# Patient Record
Sex: Female | Born: 1945 | Race: Black or African American | Hispanic: No | Smoking: Never smoker
Health system: Southern US, Community
[De-identification: ages and names within clinical notes are randomized; demographics above are authoritative.]

## PROBLEM LIST (undated history)

## (undated) DIAGNOSIS — I10 Essential (primary) hypertension: Secondary | ICD-10-CM

## (undated) DIAGNOSIS — Z8719 Personal history of other diseases of the digestive system: Secondary | ICD-10-CM

## (undated) DIAGNOSIS — H269 Unspecified cataract: Secondary | ICD-10-CM

## (undated) DIAGNOSIS — K219 Gastro-esophageal reflux disease without esophagitis: Secondary | ICD-10-CM

## (undated) DIAGNOSIS — M1990 Unspecified osteoarthritis, unspecified site: Secondary | ICD-10-CM

## (undated) DIAGNOSIS — F419 Anxiety disorder, unspecified: Secondary | ICD-10-CM

## (undated) DIAGNOSIS — E785 Hyperlipidemia, unspecified: Secondary | ICD-10-CM

## (undated) DIAGNOSIS — E119 Type 2 diabetes mellitus without complications: Secondary | ICD-10-CM

## (undated) DIAGNOSIS — K635 Polyp of colon: Secondary | ICD-10-CM

## (undated) DIAGNOSIS — J40 Bronchitis, not specified as acute or chronic: Secondary | ICD-10-CM

## (undated) DIAGNOSIS — J189 Pneumonia, unspecified organism: Secondary | ICD-10-CM

## (undated) HISTORY — PX: ESOPHAGOGASTRODUODENOSCOPY ENDOSCOPY: SHX5814

## (undated) HISTORY — PX: TUBAL LIGATION: SHX77

## (undated) HISTORY — PX: BREAST BIOPSY: SHX20

## (undated) HISTORY — PX: BREAST SURGERY: SHX581

## (undated) HISTORY — PX: ABDOMINAL HYSTERECTOMY: SHX81

## (undated) HISTORY — PX: BREAST LUMPECTOMY: SHX2

## (undated) HISTORY — PX: CARDIOVASCULAR STRESS TEST: SHX262

---

## 2011-08-12 ENCOUNTER — Other Ambulatory Visit (HOSPITAL_COMMUNITY)
Admission: RE | Admit: 2011-08-12 | Discharge: 2011-08-12 | Disposition: A | Discharge status: Home / Self Care | Payer: Medicare Other | Source: Ambulatory Visit | Attending: Family Medicine | Admitting: Family Medicine

## 2011-08-12 DIAGNOSIS — Z124 Encounter for screening for malignant neoplasm of cervix: Secondary | ICD-10-CM | POA: Insufficient documentation

## 2011-09-04 ENCOUNTER — Other Ambulatory Visit: Payer: Self-pay | Admitting: Family Medicine

## 2011-09-04 DIAGNOSIS — Z1231 Encounter for screening mammogram for malignant neoplasm of breast: Secondary | ICD-10-CM

## 2011-10-04 ENCOUNTER — Ambulatory Visit: Payer: Self-pay

## 2011-10-15 ENCOUNTER — Ambulatory Visit
Admission: RE | Admit: 2011-10-15 | Discharge: 2011-10-15 | Disposition: A | Discharge status: Home / Self Care | Payer: Medicare Other | Source: Ambulatory Visit | Attending: Family Medicine | Admitting: Family Medicine

## 2011-10-15 DIAGNOSIS — Z1231 Encounter for screening mammogram for malignant neoplasm of breast: Secondary | ICD-10-CM

## 2012-03-02 ENCOUNTER — Other Ambulatory Visit (HOSPITAL_COMMUNITY): Payer: Self-pay | Admitting: Orthopedic Surgery

## 2012-03-17 ENCOUNTER — Encounter (HOSPITAL_COMMUNITY): Payer: Self-pay | Admitting: Pharmacy Technician

## 2012-03-24 ENCOUNTER — Ambulatory Visit (HOSPITAL_COMMUNITY)
Admission: RE | Admit: 2012-03-24 | Discharge: 2012-03-24 | Disposition: A | Discharge status: Home / Self Care | Payer: Medicare Other | Source: Ambulatory Visit | Attending: Orthopedic Surgery | Admitting: Orthopedic Surgery

## 2012-03-24 ENCOUNTER — Encounter (HOSPITAL_COMMUNITY): Payer: Self-pay

## 2012-03-24 ENCOUNTER — Encounter (HOSPITAL_COMMUNITY)
Admission: RE | Admit: 2012-03-24 | Discharge: 2012-03-24 | Disposition: A | Discharge status: Home / Self Care | Payer: Medicare Other | Source: Ambulatory Visit | Attending: Orthopedic Surgery | Admitting: Orthopedic Surgery

## 2012-03-24 DIAGNOSIS — I10 Essential (primary) hypertension: Secondary | ICD-10-CM | POA: Insufficient documentation

## 2012-03-24 DIAGNOSIS — Z01812 Encounter for preprocedural laboratory examination: Secondary | ICD-10-CM | POA: Insufficient documentation

## 2012-03-24 DIAGNOSIS — Z01818 Encounter for other preprocedural examination: Secondary | ICD-10-CM | POA: Insufficient documentation

## 2012-03-24 HISTORY — DX: Unspecified osteoarthritis, unspecified site: M19.90

## 2012-03-24 HISTORY — DX: Hyperlipidemia, unspecified: E78.5

## 2012-03-24 HISTORY — DX: Anxiety disorder, unspecified: F41.9

## 2012-03-24 HISTORY — DX: Unspecified cataract: H26.9

## 2012-03-24 HISTORY — DX: Gastro-esophageal reflux disease without esophagitis: K21.9

## 2012-03-24 HISTORY — DX: Personal history of other diseases of the digestive system: Z87.19

## 2012-03-24 HISTORY — DX: Bronchitis, not specified as acute or chronic: J40

## 2012-03-24 HISTORY — DX: Type 2 diabetes mellitus without complications: E11.9

## 2012-03-24 HISTORY — DX: Pneumonia, unspecified organism: J18.9

## 2012-03-24 HISTORY — DX: Essential (primary) hypertension: I10

## 2012-03-24 LAB — BASIC METABOLIC PANEL
BUN: 20 mg/dL (ref 6–23)
Calcium: 10 mg/dL (ref 8.4–10.5)
Creatinine, Ser: 0.85 mg/dL (ref 0.50–1.10)
GFR calc Af Amer: 82 mL/min — ABNORMAL LOW (ref >90)
GFR calc non Af Amer: 70 mL/min — ABNORMAL LOW (ref >90)

## 2012-03-24 LAB — URINALYSIS, ROUTINE W REFLEX MICROSCOPIC
Glucose, UA: NEGATIVE mg/dL
Ketones, ur: NEGATIVE mg/dL
Leukocytes, UA: NEGATIVE
Nitrite: NEGATIVE
Protein, ur: NEGATIVE mg/dL
pH: 6.5 (ref 5.0–8.0)

## 2012-03-24 LAB — SURGICAL PCR SCREEN
MRSA, PCR: NEGATIVE
Staphylococcus aureus: NEGATIVE

## 2012-03-24 LAB — CBC
MCHC: 32.8 g/dL (ref 30.0–36.0)
Platelets: 273 10*3/uL (ref 150–400)
RDW: 14.4 % (ref 11.5–15.5)
WBC: 7.9 10*3/uL (ref 4.0–10.5)

## 2012-03-24 MED ORDER — CHLORHEXIDINE GLUCONATE 4 % EX LIQD: 60.0000 mL | Freq: Once | CUTANEOUS | Status: DC

## 2012-03-24 NOTE — Progress Notes (Signed)
Contacted Dr. Mila Palmer office, spoke with Judeth Cornfield. Requested last office visit notes and EKG.

## 2012-03-24 NOTE — Pre-Procedure Instructions (Signed)
20 Mercie Sailors  03/24/2012   Your procedure is scheduled on:  Tuesday Novmeber 12, 2013  Report to [facility] at 9:25 AM.  Call this number if you have problems the morning of surgery: [phone]   Remember:   Do not eat food or drink :After Midnight.      Take these medicines the morning of surgery with A SIP OF WATER: amlodipine, nexium, ativan,    Do not wear jewelry, make-up or nail polish.  Do not wear lotions, powders, or perfumes.   Do not shave 48 hours prior to surgery. Men may shave face and neck.  Do not bring valuables to the hospital.  Contacts, dentures or bridgework may not be worn into surgery.  Leave suitcase in the car. After surgery it may be brought to your room.  For patients admitted to the hospital, checkout time is 11:00 AM the day of discharge.   Patients discharged the day of surgery will not be allowed to drive home.  Name and phone number of your driver: Donna Wu  Special Instructions: Shower using CHG 2 nights before surgery and the night before surgery.  If you shower the day of surgery use CHG.  Use special wash - you have one bottle of CHG for all showers.  You should use approximately 1/3 of the bottle for each shower.   Please read over the following fact sheets that you were given: Pain Booklet, Coughing and Deep Breathing, Blood Transfusion Information, Total Joint Packet, MRSA Information and Surgical Site Infection Prevention

## 2012-03-25 LAB — URINE CULTURE: Colony Count: NO GROWTH

## 2012-03-25 NOTE — Consult Note (Signed)
Anesthesia chart review: Patient is a 66 year old female scheduled for left total knee arthroplasty by Dr. August Saucer on 03/31/2012. History includes nonsmoker, hyperlipidemia, anxiety, GERD, diabetes mellitus type 2, hypertension, cataracts, arthritis, history of bronchitis and pneumonia.  Labs noted.  Chest x-ray on 03/24/2012 showed no evidence of acute cardiopulmonary abnormality.  EKG on 03/24/2012 showed sinus tachycardia at 111 beats per minute, occasional PVC, cannot rule out anterior infarct, age undetermined.  Her HR was 106 at the time of vitals on 03/24/12.  Her heart rate has increased and she now has a single PVC on EKG, otherwise I do not think that there is a significant change from her EKG from 08/12/11 from her PCP Dr. Hervey Ard.  Her last stress test was approximately 7 years ago.  No CV symptoms documented at her PAT visit.  She will be evaluated by her anesthesiologist on the day of surgery.  If she remains asymptomatic from a CV standpoint then anticipate she can proceed as planned.  Shonna Chock, PA-C

## 2012-03-30 MED ORDER — CEFAZOLIN SODIUM-DEXTROSE 2-3 GM-% IV SOLR
2.0000 g | INTRAVENOUS | Status: AC
  Administered 2012-03-31: 2 g via INTRAVENOUS
  Filled 2012-03-30: qty 50

## 2012-03-31 ENCOUNTER — Inpatient Hospital Stay (HOSPITAL_COMMUNITY)
Admission: RE | Admit: 2012-03-31 | Discharge: 2012-04-03 | DRG: 470 | Disposition: A | Discharge status: Skilled Nursing Facility | Payer: Medicare Other | Source: Ambulatory Visit | Attending: Orthopedic Surgery | Admitting: Orthopedic Surgery

## 2012-03-31 ENCOUNTER — Encounter (HOSPITAL_COMMUNITY): Payer: Self-pay | Admitting: Vascular Surgery

## 2012-03-31 ENCOUNTER — Encounter (HOSPITAL_COMMUNITY): Payer: Self-pay | Admitting: *Deleted

## 2012-03-31 ENCOUNTER — Encounter (HOSPITAL_COMMUNITY)
Admission: RE | Disposition: A | Discharge status: Skilled Nursing Facility | Payer: Self-pay | Source: Ambulatory Visit | Attending: Orthopedic Surgery

## 2012-03-31 ENCOUNTER — Inpatient Hospital Stay (HOSPITAL_COMMUNITY): Payer: Medicare Other | Admitting: Vascular Surgery

## 2012-03-31 DIAGNOSIS — K449 Diaphragmatic hernia without obstruction or gangrene: Secondary | ICD-10-CM | POA: Diagnosis present

## 2012-03-31 DIAGNOSIS — E785 Hyperlipidemia, unspecified: Secondary | ICD-10-CM | POA: Diagnosis present

## 2012-03-31 DIAGNOSIS — M1710 Unilateral primary osteoarthritis, unspecified knee: Principal | ICD-10-CM | POA: Diagnosis present

## 2012-03-31 DIAGNOSIS — Z7982 Long term (current) use of aspirin: Secondary | ICD-10-CM

## 2012-03-31 DIAGNOSIS — Z79899 Other long term (current) drug therapy: Secondary | ICD-10-CM

## 2012-03-31 DIAGNOSIS — F411 Generalized anxiety disorder: Secondary | ICD-10-CM | POA: Diagnosis present

## 2012-03-31 DIAGNOSIS — K219 Gastro-esophageal reflux disease without esophagitis: Secondary | ICD-10-CM | POA: Diagnosis present

## 2012-03-31 DIAGNOSIS — H269 Unspecified cataract: Secondary | ICD-10-CM | POA: Diagnosis present

## 2012-03-31 DIAGNOSIS — D62 Acute posthemorrhagic anemia: Secondary | ICD-10-CM | POA: Diagnosis not present

## 2012-03-31 DIAGNOSIS — I10 Essential (primary) hypertension: Secondary | ICD-10-CM | POA: Diagnosis present

## 2012-03-31 DIAGNOSIS — E119 Type 2 diabetes mellitus without complications: Secondary | ICD-10-CM | POA: Diagnosis present

## 2012-03-31 DIAGNOSIS — E669 Obesity, unspecified: Secondary | ICD-10-CM | POA: Diagnosis present

## 2012-03-31 DIAGNOSIS — Z23 Encounter for immunization: Secondary | ICD-10-CM

## 2012-03-31 HISTORY — PX: TOTAL KNEE ARTHROPLASTY: SHX125

## 2012-03-31 LAB — PROTIME-INR
INR: 1.06 (ref 0.00–1.49)
Prothrombin Time: 13.7 seconds (ref 11.6–15.2)

## 2012-03-31 LAB — GLUCOSE, CAPILLARY
Glucose-Capillary: 117 mg/dL — ABNORMAL HIGH (ref 70–99)
Glucose-Capillary: 127 mg/dL — ABNORMAL HIGH (ref 70–99)
Glucose-Capillary: 167 mg/dL — ABNORMAL HIGH (ref 70–99)

## 2012-03-31 SURGERY — ARTHROPLASTY, KNEE, TOTAL
Anesthesia: General | Site: Knee | Laterality: Left | Wound class: Clean

## 2012-03-31 MED ORDER — MORPHINE SULFATE (PF) 1 MG/ML IV SOLN
INTRAVENOUS | Status: AC
  Filled 2012-03-31: qty 25

## 2012-03-31 MED ORDER — LACTATED RINGERS IV SOLN
INTRAVENOUS | Status: DC
  Administered 2012-03-31: 11:00:00 via INTRAVENOUS

## 2012-03-31 MED ORDER — BENAZEPRIL HCL 40 MG PO TABS
80.0000 mg | ORAL_TABLET | Freq: Every day | ORAL | Status: DC
  Administered 2012-04-01 – 2012-04-03 (×3): 80 mg via ORAL
  Filled 2012-03-31 (×3): qty 2

## 2012-03-31 MED ORDER — ACETAMINOPHEN 10 MG/ML IV SOLN
INTRAVENOUS | Status: DC | PRN
  Administered 2012-03-31: 1000 mg via INTRAVENOUS

## 2012-03-31 MED ORDER — MIDAZOLAM HCL 5 MG/5ML IJ SOLN
INTRAMUSCULAR | Status: DC | PRN
  Administered 2012-03-31: 2 mg via INTRAVENOUS

## 2012-03-31 MED ORDER — BUPIVACAINE-EPINEPHRINE 0.25% -1:200000 IJ SOLN
INTRAMUSCULAR | Status: DC | PRN
  Administered 2012-03-31: 20 mL

## 2012-03-31 MED ORDER — VITAMIN D3 25 MCG (1000 UNIT) PO TABS
2000.0000 [IU] | ORAL_TABLET | Freq: Every day | ORAL | Status: DC
  Administered 2012-04-01 – 2012-04-03 (×3): 2000 [IU] via ORAL
  Filled 2012-03-31 (×3): qty 2

## 2012-03-31 MED ORDER — FENTANYL CITRATE 0.05 MG/ML IJ SOLN
INTRAMUSCULAR | Status: DC | PRN
  Administered 2012-03-31 (×6): 50 ug via INTRAVENOUS
  Administered 2012-03-31: 100 ug via INTRAVENOUS
  Administered 2012-03-31 (×2): 50 ug via INTRAVENOUS

## 2012-03-31 MED ORDER — METHOCARBAMOL 500 MG PO TABS
500.0000 mg | ORAL_TABLET | Freq: Four times a day (QID) | ORAL | Status: DC | PRN
  Administered 2012-04-01: 500 mg via ORAL
  Filled 2012-03-31 (×2): qty 1

## 2012-03-31 MED ORDER — ROCURONIUM BROMIDE 100 MG/10ML IV SOLN
INTRAVENOUS | Status: DC | PRN
  Administered 2012-03-31: 50 mg via INTRAVENOUS

## 2012-03-31 MED ORDER — ARTIFICIAL TEARS OP OINT
TOPICAL_OINTMENT | OPHTHALMIC | Status: DC | PRN
  Administered 2012-03-31: 1 via OPHTHALMIC

## 2012-03-31 MED ORDER — BUPIVACAINE HCL (PF) 0.25 % IJ SOLN
INTRAMUSCULAR | Status: AC
  Filled 2012-03-31: qty 30

## 2012-03-31 MED ORDER — MORPHINE SULFATE (PF) 1 MG/ML IV SOLN
INTRAVENOUS | Status: DC
Start: 2012-03-31 — End: 2012-04-02
  Administered 2012-03-31: 6 mg via INTRAVENOUS
  Administered 2012-03-31: 15:00:00 via INTRAVENOUS
  Administered 2012-04-01: 9.94 mg via INTRAVENOUS
  Administered 2012-04-01: 7.3 mg via INTRAVENOUS
  Administered 2012-04-01: 15:00:00 via INTRAVENOUS
  Administered 2012-04-01: 25 mg via INTRAVENOUS
  Administered 2012-04-01: 7.5 mg via INTRAVENOUS
  Administered 2012-04-01: 3 mg via INTRAVENOUS
  Administered 2012-04-01: 12 mg via INTRAVENOUS
  Administered 2012-04-01: 13.2 mg via INTRAVENOUS
  Administered 2012-04-02: 3 mg via INTRAVENOUS
  Filled 2012-03-31 (×3): qty 25

## 2012-03-31 MED ORDER — WARFARIN - PHARMACIST DOSING INPATIENT
Freq: Every day | Status: DC
Start: 2012-03-31 — End: 2012-04-03

## 2012-03-31 MED ORDER — MENTHOL 3 MG MT LOZG: 1.0000 | LOZENGE | OROMUCOSAL | Status: DC | PRN

## 2012-03-31 MED ORDER — PROMETHAZINE HCL 25 MG/ML IJ SOLN: 6.2500 mg | INTRAMUSCULAR | Status: DC | PRN

## 2012-03-31 MED ORDER — AMLODIPINE BESY-BENAZEPRIL HCL 5-40 MG PO CAPS: 2.0000 | ORAL_CAPSULE | Freq: Every day | ORAL | Status: DC

## 2012-03-31 MED ORDER — ASPIRIN EC 81 MG PO TBEC
81.0000 mg | DELAYED_RELEASE_TABLET | Freq: Every day | ORAL | Status: DC
  Administered 2012-03-31 – 2012-04-03 (×4): 81 mg via ORAL
  Filled 2012-03-31 (×5): qty 1

## 2012-03-31 MED ORDER — SODIUM CHLORIDE 0.9 % IJ SOLN: 9.0000 mL | INTRAMUSCULAR | Status: DC | PRN

## 2012-03-31 MED ORDER — CLONIDINE HCL (ANALGESIA) 100 MCG/ML EP SOLN
150.0000 ug | EPIDURAL | Status: AC
  Administered 2012-03-31: 1 mL via INTRA_ARTICULAR
  Filled 2012-03-31: qty 1.5

## 2012-03-31 MED ORDER — HYDROCODONE-ACETAMINOPHEN 7.5-325 MG PO TABS
1.0000 | ORAL_TABLET | Freq: Four times a day (QID) | ORAL | Status: DC
  Administered 2012-04-01 – 2012-04-03 (×8): 1 via ORAL
  Filled 2012-03-31 (×8): qty 1

## 2012-03-31 MED ORDER — POTASSIUM CHLORIDE IN NACL 20-0.45 MEQ/L-% IV SOLN
INTRAVENOUS | Status: DC
  Administered 2012-04-01: 1000 mL via INTRAVENOUS
  Filled 2012-03-31 (×3): qty 1000

## 2012-03-31 MED ORDER — HYDROMORPHONE HCL PF 1 MG/ML IJ SOLN
INTRAMUSCULAR | Status: AC
  Filled 2012-03-31: qty 1

## 2012-03-31 MED ORDER — LACTATED RINGERS IV SOLN
INTRAVENOUS | Status: DC | PRN
  Administered 2012-03-31 (×3): via INTRAVENOUS

## 2012-03-31 MED ORDER — AMLODIPINE BESYLATE 10 MG PO TABS
10.0000 mg | ORAL_TABLET | Freq: Every day | ORAL | Status: DC
  Administered 2012-04-01 – 2012-04-03 (×3): 10 mg via ORAL
  Filled 2012-03-31 (×3): qty 1

## 2012-03-31 MED ORDER — CEFAZOLIN SODIUM 1-5 GM-% IV SOLN
1.0000 g | Freq: Four times a day (QID) | INTRAVENOUS | Status: AC
  Administered 2012-03-31 (×2): 1 g via INTRAVENOUS
  Filled 2012-03-31 (×3): qty 50

## 2012-03-31 MED ORDER — PANTOPRAZOLE SODIUM 40 MG PO TBEC
80.0000 mg | DELAYED_RELEASE_TABLET | Freq: Every day | ORAL | Status: DC
  Administered 2012-04-01 – 2012-04-03 (×3): 80 mg via ORAL
  Filled 2012-03-31 (×3): qty 2

## 2012-03-31 MED ORDER — NEOSTIGMINE METHYLSULFATE 1 MG/ML IJ SOLN
INTRAMUSCULAR | Status: DC | PRN
  Administered 2012-03-31: 3 mg via INTRAVENOUS

## 2012-03-31 MED ORDER — LORAZEPAM 0.5 MG PO TABS: 0.5000 mg | ORAL_TABLET | Freq: Three times a day (TID) | ORAL | Status: DC | PRN

## 2012-03-31 MED ORDER — ACETAMINOPHEN 10 MG/ML IV SOLN
INTRAVENOUS | Status: AC
  Filled 2012-03-31: qty 100

## 2012-03-31 MED ORDER — LIDOCAINE HCL (CARDIAC) 20 MG/ML IV SOLN
INTRAVENOUS | Status: DC | PRN
  Administered 2012-03-31: 80 mg via INTRAVENOUS

## 2012-03-31 MED ORDER — FLEET ENEMA 7-19 GM/118ML RE ENEM: 1.0000 | ENEMA | Freq: Once | RECTAL | Status: AC | PRN

## 2012-03-31 MED ORDER — PHENYLEPHRINE HCL 10 MG/ML IJ SOLN
INTRAMUSCULAR | Status: DC | PRN
  Administered 2012-03-31 (×4): 40 ug via INTRAVENOUS
  Administered 2012-03-31: 80 ug via INTRAVENOUS
  Administered 2012-03-31: 40 ug via INTRAVENOUS

## 2012-03-31 MED ORDER — HYDROCHLOROTHIAZIDE 25 MG PO TABS
25.0000 mg | ORAL_TABLET | Freq: Every day | ORAL | Status: DC
  Administered 2012-04-01 – 2012-04-03 (×3): 25 mg via ORAL
  Filled 2012-03-31 (×3): qty 1

## 2012-03-31 MED ORDER — ONDANSETRON HCL 4 MG PO TABS: 4.0000 mg | ORAL_TABLET | Freq: Four times a day (QID) | ORAL | Status: DC | PRN

## 2012-03-31 MED ORDER — HYDROMORPHONE HCL PF 1 MG/ML IJ SOLN
0.2500 mg | INTRAMUSCULAR | Status: DC | PRN
  Administered 2012-03-31 (×2): 0.5 mg via INTRAVENOUS

## 2012-03-31 MED ORDER — SENNOSIDES-DOCUSATE SODIUM 8.6-50 MG PO TABS: 1.0000 | ORAL_TABLET | Freq: Every evening | ORAL | Status: DC | PRN

## 2012-03-31 MED ORDER — GLYCOPYRROLATE 0.2 MG/ML IJ SOLN
INTRAMUSCULAR | Status: DC | PRN
  Administered 2012-03-31: 0.4 mg via INTRAVENOUS

## 2012-03-31 MED ORDER — WARFARIN SODIUM 5 MG PO TABS
5.0000 mg | ORAL_TABLET | Freq: Once | ORAL | Status: AC
  Administered 2012-03-31: 5 mg via ORAL
  Filled 2012-03-31: qty 1

## 2012-03-31 MED ORDER — ONDANSETRON HCL 4 MG/2ML IJ SOLN: 4.0000 mg | Freq: Four times a day (QID) | INTRAMUSCULAR | Status: DC | PRN

## 2012-03-31 MED ORDER — MORPHINE SULFATE 4 MG/ML IJ SOLN
INTRAMUSCULAR | Status: DC | PRN
  Administered 2012-03-31: 8 mg

## 2012-03-31 MED ORDER — SIMVASTATIN 40 MG PO TABS
40.0000 mg | ORAL_TABLET | Freq: Every day | ORAL | Status: DC
  Administered 2012-03-31 – 2012-04-02 (×3): 40 mg via ORAL
  Filled 2012-03-31 (×4): qty 1

## 2012-03-31 MED ORDER — ACETAMINOPHEN 650 MG RE SUPP: 650.0000 mg | Freq: Four times a day (QID) | RECTAL | Status: DC | PRN

## 2012-03-31 MED ORDER — PNEUMOCOCCAL VAC POLYVALENT 25 MCG/0.5ML IJ INJ
0.5000 mL | INJECTION | INTRAMUSCULAR | Status: AC
  Administered 2012-04-01: 0.5 mL via INTRAMUSCULAR
  Filled 2012-03-31: qty 0.5

## 2012-03-31 MED ORDER — PROPOFOL 10 MG/ML IV BOLUS
INTRAVENOUS | Status: DC | PRN
  Administered 2012-03-31: 170 mg via INTRAVENOUS

## 2012-03-31 MED ORDER — PHENOL 1.4 % MT LIQD: 1.0000 | OROMUCOSAL | Status: DC | PRN

## 2012-03-31 MED ORDER — DEXTROSE 5 % IV SOLN
500.0000 mg | Freq: Four times a day (QID) | INTRAVENOUS | Status: DC | PRN
  Administered 2012-03-31: 500 mg via INTRAVENOUS
  Filled 2012-03-31: qty 5

## 2012-03-31 MED ORDER — DIPHENHYDRAMINE HCL 12.5 MG/5ML PO ELIX: 12.5000 mg | ORAL_SOLUTION | Freq: Four times a day (QID) | ORAL | Status: DC | PRN

## 2012-03-31 MED ORDER — OXYCODONE HCL 5 MG PO TABS: 5.0000 mg | ORAL_TABLET | Freq: Once | ORAL | Status: DC | PRN

## 2012-03-31 MED ORDER — DIPHENHYDRAMINE HCL 12.5 MG/5ML PO ELIX: 12.5000 mg | ORAL_SOLUTION | ORAL | Status: DC | PRN

## 2012-03-31 MED ORDER — OXYCODONE HCL 5 MG/5ML PO SOLN: 5.0000 mg | Freq: Once | ORAL | Status: DC | PRN

## 2012-03-31 MED ORDER — BUPIVACAINE-EPINEPHRINE PF 0.25-1:200000 % IJ SOLN
INTRAMUSCULAR | Status: AC
  Filled 2012-03-31: qty 30

## 2012-03-31 MED ORDER — ACETAMINOPHEN 325 MG PO TABS
650.0000 mg | ORAL_TABLET | Freq: Four times a day (QID) | ORAL | Status: DC | PRN
  Administered 2012-04-03: 650 mg via ORAL
  Filled 2012-03-31: qty 2

## 2012-03-31 MED ORDER — DIPHENHYDRAMINE HCL 50 MG/ML IJ SOLN: 12.5000 mg | Freq: Four times a day (QID) | INTRAMUSCULAR | Status: DC | PRN

## 2012-03-31 MED ORDER — OXYCODONE HCL 5 MG PO TABS
5.0000 mg | ORAL_TABLET | ORAL | Status: DC | PRN
Start: 2012-03-31 — End: 2012-04-03
  Administered 2012-04-01 (×2): 5 mg via ORAL
  Administered 2012-04-03: 10 mg via ORAL
  Filled 2012-03-31 (×2): qty 1
  Filled 2012-03-31: qty 2

## 2012-03-31 MED ORDER — DOCUSATE SODIUM 100 MG PO CAPS
100.0000 mg | ORAL_CAPSULE | Freq: Two times a day (BID) | ORAL | Status: DC
  Administered 2012-03-31 – 2012-04-03 (×6): 100 mg via ORAL
  Filled 2012-03-31 (×6): qty 1

## 2012-03-31 MED ORDER — METOCLOPRAMIDE HCL 5 MG/ML IJ SOLN: 5.0000 mg | Freq: Three times a day (TID) | INTRAMUSCULAR | Status: DC | PRN

## 2012-03-31 MED ORDER — MEPERIDINE HCL 25 MG/ML IJ SOLN: 6.2500 mg | INTRAMUSCULAR | Status: DC | PRN

## 2012-03-31 MED ORDER — ALUM & MAG HYDROXIDE-SIMETH 200-200-20 MG/5ML PO SUSP: 30.0000 mL | ORAL | Status: DC | PRN

## 2012-03-31 MED ORDER — MORPHINE SULFATE 4 MG/ML IJ SOLN
INTRAMUSCULAR | Status: AC
  Filled 2012-03-31: qty 2

## 2012-03-31 MED ORDER — INSULIN ASPART 100 UNIT/ML ~~LOC~~ SOLN
0.0000 [IU] | Freq: Three times a day (TID) | SUBCUTANEOUS | Status: DC
  Administered 2012-04-01: 5 [IU] via SUBCUTANEOUS
  Administered 2012-04-01 – 2012-04-02 (×2): 2 [IU] via SUBCUTANEOUS
  Administered 2012-04-02: 3 [IU] via SUBCUTANEOUS

## 2012-03-31 MED ORDER — METOCLOPRAMIDE HCL 10 MG PO TABS: 5.0000 mg | ORAL_TABLET | Freq: Three times a day (TID) | ORAL | Status: DC | PRN

## 2012-03-31 MED ORDER — ZOLPIDEM TARTRATE 5 MG PO TABS: 5.0000 mg | ORAL_TABLET | Freq: Every evening | ORAL | Status: DC | PRN

## 2012-03-31 MED ORDER — BISACODYL 10 MG RE SUPP: 10.0000 mg | Freq: Every day | RECTAL | Status: DC | PRN

## 2012-03-31 MED ORDER — NALOXONE HCL 0.4 MG/ML IJ SOLN: 0.4000 mg | INTRAMUSCULAR | Status: DC | PRN

## 2012-03-31 MED ORDER — LINAGLIPTIN 5 MG PO TABS
5.0000 mg | ORAL_TABLET | Freq: Every day | ORAL | Status: DC
  Administered 2012-04-01 – 2012-04-03 (×3): 5 mg via ORAL
  Filled 2012-03-31 (×4): qty 1

## 2012-03-31 MED ORDER — ONDANSETRON HCL 4 MG/2ML IJ SOLN
INTRAMUSCULAR | Status: DC | PRN
  Administered 2012-03-31: 4 mg via INTRAVENOUS

## 2012-03-31 MED ORDER — VITAMIN D 50 MCG (2000 UT) PO CAPS: 2000.0000 [IU] | ORAL_CAPSULE | Freq: Every day | ORAL | Status: DC

## 2012-03-31 SURGICAL SUPPLY — 73 items
BANDAGE ELASTIC 4 VELCRO ST LF (GAUZE/BANDAGES/DRESSINGS) ×2 IMPLANT
BANDAGE ELASTIC 6 VELCRO ST LF (GAUZE/BANDAGES/DRESSINGS) ×2 IMPLANT
BANDAGE ESMARK 6X9 LF (GAUZE/BANDAGES/DRESSINGS) ×1 IMPLANT
BLADE SAG 18X100X1.27 (BLADE) ×2 IMPLANT
BLADE SAW SGTL 13.0X1.19X90.0M (BLADE) ×2 IMPLANT
BNDG COHESIVE 6X5 TAN STRL LF (GAUZE/BANDAGES/DRESSINGS) ×2 IMPLANT
BNDG ELASTIC 6X10 VLCR STRL LF (GAUZE/BANDAGES/DRESSINGS) ×6 IMPLANT
BNDG ESMARK 6X9 LF (GAUZE/BANDAGES/DRESSINGS) ×2
BOWL SMART MIX CTS (DISPOSABLE) ×2 IMPLANT
CEMENT BONE SIMPLEX SPEEDSET (Cement) ×4 IMPLANT
CLOTH BEACON ORANGE TIMEOUT ST (SAFETY) ×2 IMPLANT
COVER BACK TABLE 24X17X13 BIG (DRAPES) IMPLANT
COVER SURGICAL LIGHT HANDLE (MISCELLANEOUS) ×2 IMPLANT
CUFF TOURNIQUET SINGLE 34IN LL (TOURNIQUET CUFF) IMPLANT
CUFF TOURNIQUET SINGLE 44IN (TOURNIQUET CUFF) IMPLANT
DRAPE INCISE IOBAN 66X45 STRL (DRAPES) IMPLANT
DRAPE ORTHO SPLIT 77X108 STRL (DRAPES) ×2
DRAPE PROXIMA HALF (DRAPES) ×2 IMPLANT
DRAPE SURG ORHT 6 SPLT 77X108 (DRAPES) ×2 IMPLANT
DRAPE U-SHAPE 47X51 STRL (DRAPES) ×2 IMPLANT
DRAPE X-RAY CASS 24X20 (DRAPES) IMPLANT
DRSG PAD ABDOMINAL 8X10 ST (GAUZE/BANDAGES/DRESSINGS) ×2 IMPLANT
DURAPREP 26ML APPLICATOR (WOUND CARE) ×2 IMPLANT
ELECT REM PT RETURN 9FT ADLT (ELECTROSURGICAL) ×2
ELECTRODE REM PT RTRN 9FT ADLT (ELECTROSURGICAL) ×1 IMPLANT
EVACUATOR 1/8 PVC DRAIN (DRAIN) ×2 IMPLANT
FACESHIELD LNG OPTICON STERILE (SAFETY) ×2 IMPLANT
GAUZE XEROFORM 5X9 LF (GAUZE/BANDAGES/DRESSINGS) ×2 IMPLANT
GLOVE BIO SURGEON ST LM GN SZ9 (GLOVE) ×2 IMPLANT
GLOVE BIOGEL PI IND STRL 8 (GLOVE) ×1 IMPLANT
GLOVE BIOGEL PI INDICATOR 8 (GLOVE) ×1
GLOVE SURG ORTHO 8.0 STRL STRW (GLOVE) ×2 IMPLANT
GOWN PREVENTION PLUS LG XLONG (DISPOSABLE) IMPLANT
GOWN PREVENTION PLUS XLARGE (GOWN DISPOSABLE) ×2 IMPLANT
GOWN STRL NON-REIN LRG LVL3 (GOWN DISPOSABLE) ×6 IMPLANT
HANDPIECE INTERPULSE COAX TIP (DISPOSABLE) ×1
HOOD PEEL AWAY FACE SHEILD DIS (HOOD) ×6 IMPLANT
IMMOBILIZER KNEE 20 (SOFTGOODS)
IMMOBILIZER KNEE 20 THIGH 36 (SOFTGOODS) IMPLANT
IMMOBILIZER KNEE 22 UNIV (SOFTGOODS) IMPLANT
IMMOBILIZER KNEE 24 THIGH 36 (MISCELLANEOUS) IMPLANT
IMMOBILIZER KNEE 24 UNIV (MISCELLANEOUS)
KIT BASIN OR (CUSTOM PROCEDURE TRAY) ×2 IMPLANT
KIT ROOM TURNOVER OR (KITS) ×2 IMPLANT
MANIFOLD NEPTUNE II (INSTRUMENTS) ×2 IMPLANT
MARKER SPHERE PSV REFLC THRD 5 (MARKER) ×6 IMPLANT
NEEDLE 18GX1X1/2 (RX/OR ONLY) (NEEDLE) ×2 IMPLANT
NEEDLE SPNL 18GX3.5 QUINCKE PK (NEEDLE) ×2 IMPLANT
NS IRRIG 1000ML POUR BTL (IV SOLUTION) ×2 IMPLANT
PACK TOTAL JOINT (CUSTOM PROCEDURE TRAY) ×2 IMPLANT
PAD ARMBOARD 7.5X6 YLW CONV (MISCELLANEOUS) ×4 IMPLANT
PAD CAST 4YDX4 CTTN HI CHSV (CAST SUPPLIES) ×1 IMPLANT
PADDING CAST COTTON 4X4 STRL (CAST SUPPLIES) ×1
PADDING CAST COTTON 6X4 STRL (CAST SUPPLIES) ×2 IMPLANT
PIN SCHANZ 4MM 130MM (PIN) ×8 IMPLANT
RUBBERBAND STERILE (MISCELLANEOUS) ×2 IMPLANT
SET HNDPC FAN SPRY TIP SCT (DISPOSABLE) ×1 IMPLANT
SPONGE GAUZE 4X4 12PLY (GAUZE/BANDAGES/DRESSINGS) ×2 IMPLANT
SPONGE LAP 18X18 X RAY DECT (DISPOSABLE) IMPLANT
STAPLER VISISTAT 35W (STAPLE) ×2 IMPLANT
SUCTION FRAZIER TIP 10 FR DISP (SUCTIONS) ×2 IMPLANT
SUT ETHILON 3 0 PS 1 (SUTURE) ×4 IMPLANT
SUT VIC AB 0 CTB1 27 (SUTURE) ×6 IMPLANT
SUT VIC AB 1 CT1 27 (SUTURE) ×5
SUT VIC AB 1 CT1 27XBRD ANBCTR (SUTURE) ×5 IMPLANT
SUT VIC AB 2-0 CT1 27 (SUTURE) ×2
SUT VIC AB 2-0 CT1 TAPERPNT 27 (SUTURE) ×2 IMPLANT
SYR 30ML SLIP (SYRINGE) ×2 IMPLANT
SYR TB 1ML LUER SLIP (SYRINGE) ×2 IMPLANT
TOWEL OR 17X24 6PK STRL BLUE (TOWEL DISPOSABLE) ×2 IMPLANT
TOWEL OR 17X26 10 PK STRL BLUE (TOWEL DISPOSABLE) ×4 IMPLANT
TRAY FOLEY CATH 14FR (SET/KITS/TRAYS/PACK) ×2 IMPLANT
WATER STERILE IRR 1000ML POUR (IV SOLUTION) ×6 IMPLANT

## 2012-03-31 NOTE — Progress Notes (Signed)
Orthopedic Tech Progress Note Patient Details:  Donna Wu 12-12-1945 [id number]  CPM Left Knee CPM Left Knee: On Left Knee Flexion (Degrees): 40  Left Knee Extension (Degrees): 0  Additional Comments: trapeze bar   Cammer, Mickie Bail 03/31/2012, 3:40 PM

## 2012-03-31 NOTE — H&P (Signed)
TOTAL KNEE ADMISSION H&P  Patient is being admitted for left total knee arthroplasty.  Subjective:  Chief Complaint:left knee pain.  HPI: Donna Wu, 66 y.o. female, has a history of pain and functional disability in the left knee due to arthritis and has failed non-surgical conservative treatments for greater than 12 weeks to includeNSAID's and/or analgesics and corticosteriod injections.  Onset of symptoms was gradual, starting 5 years ago with gradually worsening course since that time. The patient noted no past surgery on the left knee(s).  Patient currently rates pain in the left knee(s) at 8 out of 10 with activity. Patient has night pain, worsening of pain with activity and weight bearing and pain that interferes with activities of daily living.  Patient has evidence of periarticular osteophytes, joint subluxation and joint space narrowing by imaging studies. This patient has had no relief with conservative treatment. There is no active infection.  There are no active problems to display for this patient.  Past Medical History  Diagnosis Date  . Hyperlipidemia   . Anxiety   . Pneumonia     "as a baby"  . Bronchitis     hx of  . Diabetes mellitus without complication     on oral medicine  . GERD (gastroesophageal reflux disease)   . H/O hiatal hernia   . Arthritis   . Cataracts, bilateral   . Hypertension     Dr. Johnn Hai     Past Surgical History  Procedure Date  . Cardiovascular stress test     done approx. 7 years ago  . Esophagogastroduodenoscopy endoscopy   . Abdominal hysterectomy   . Breast surgery   . Tubal ligation     Prescriptions prior to admission  Medication Sig Dispense Refill  . amLODipine-benazepril (LOTREL) 5-40 MG per capsule Take 2 capsules by mouth daily.      Marland Kitchen aspirin EC 81 MG tablet Take 81 mg by mouth daily.      . Cholecalciferol (VITAMIN D) 2000 UNITS CAPS Take 2,000 Units by mouth daily.      Marland Kitchen esomeprazole (NEXIUM) 40 MG capsule  Take 40 mg by mouth daily before breakfast.      . hydrochlorothiazide (HYDRODIURIL) 25 MG tablet Take 25 mg by mouth daily.      Marland Kitchen LORazepam (ATIVAN) 0.5 MG tablet Take 0.5 mg by mouth 3 (three) times daily as needed. For anxiety      . saxagliptin HCl (ONGLYZA) 5 MG TABS tablet Take 5 mg by mouth daily.      . simvastatin (ZOCOR) 40 MG tablet Take 40 mg by mouth at bedtime.       No Known Allergies  History  Substance Use Topics  . Smoking status: Never Smoker   . Smokeless tobacco: Not on file  . Alcohol Use: No    History reviewed. No pertinent family history.   Review of Systems  Constitutional: Negative.   HENT: Negative.   Eyes: Negative.   Respiratory: Negative.   Cardiovascular: Negative.   Gastrointestinal: Negative.   Genitourinary: Negative.   Musculoskeletal:       "both knees are bad"  More pain in the left knee with recurrent swelling and pain with ambulation.  Skin: Negative.   Neurological: Negative.   Endo/Heme/Allergies: Negative.   Psychiatric/Behavioral: Negative.     Objective:  Physical Exam  Constitutional: She is oriented to person, place, and time. She appears well-developed and well-nourished.  HENT:  Head: Normocephalic and atraumatic.  Eyes: EOM are normal.  Pupils are equal, round, and reactive to light.  Neck: Normal range of motion. Neck supple.  Cardiovascular: Normal rate, regular rhythm, normal heart sounds and intact distal pulses.   Respiratory: Effort normal and breath sounds normal.  GI: Soft. Bowel sounds are normal.  Musculoskeletal:       Left knee with varus deformity.  Crepitus with ROM, tender medial joint line.  Mild effusion left knee.  No edema of lower extremities.    Neurological: She is alert and oriented to person, place, and time.  Skin: Skin is warm and dry.  Psychiatric: She has a normal mood and affect.    Vital signs in last 24 hours: Temp:  [98.4 F (36.9 C)] 98.4 F (36.9 C) (11/12 0953) Pulse Rate:  [101]  101  (11/12 0953) Resp:  [18] 18  (11/12 0953) BP: (121)/(81) 121/81 mmHg (11/12 0953) SpO2:  [100 %] 100 % (11/12 0953)  Labs:   There is no height or weight on file to calculate BMI.   Imaging Review Plain radiographs demonstrate severe degenerative joint disease of the left knee(s). The overall alignment is varus. The bone quality appears to be adequate for age and reported activity level.  Assessment/Plan:  End stage arthritis, left knee   The patient history, physical examination, clinical judgment of the provider and imaging studies are consistent with end stage degenerative joint disease of the left knee(s) and total knee arthroplasty is deemed medically necessary. The treatment options including medical management, injection therapy arthroscopy and arthroplasty were discussed at length. The risks and benefits of total knee arthroplasty were presented and reviewed. The risks due to aseptic loosening, infection, stiffness, patella tracking problems, thromboembolic complications and other imponderables were discussed. The patient acknowledged the explanation, agreed to proceed with the plan and consent was signed. Patient is being admitted for inpatient treatment for surgery, pain control, PT, OT, prophylactic antibiotics, VTE prophylaxis, progressive ambulation and ADL's and discharge planning. The patient is planning to be discharged home with home health services

## 2012-03-31 NOTE — Plan of Care (Signed)
Problem: Consults Goal: Diagnosis- Total Joint Replacement Primary Total Knee     

## 2012-03-31 NOTE — Anesthesia Postprocedure Evaluation (Signed)
  Anesthesia Post-op Note  Patient: Donna Wu  Procedure(s) Performed: Procedure(s) (LRB) with comments: TOTAL KNEE ARTHROPLASTY (Left) - Left total knee arthroplasty  Patient Location: PACU  Anesthesia Type:General  Level of Consciousness: awake  Airway and Oxygen Therapy: Patient Spontanous Breathing  Post-op Pain: moderate  Post-op Assessment: Post-op Vital signs reviewed  Post-op Vital Signs: stable  Complications: No apparent anesthesia complications

## 2012-03-31 NOTE — Transfer of Care (Signed)
Immediate Anesthesia Transfer of Care Note  Patient: Donna Wu  Procedure(s) Performed: Procedure(s) (LRB) with comments: TOTAL KNEE ARTHROPLASTY (Left) - Left total knee arthroplasty  Patient Location: PACU  Anesthesia Type:General  Level of Consciousness: awake, alert  and oriented  Airway & Oxygen Therapy: Patient Spontanous Breathing and Patient connected to face mask oxygen  Post-op Assessment: Report given to PACU RN and Post -op Vital signs reviewed and stable  Post vital signs: Reviewed and stable  Complications: No apparent anesthesia complications

## 2012-03-31 NOTE — Preoperative (Signed)
Beta Blockers   Reason not to administer Beta Blockers:Not Applicable 

## 2012-03-31 NOTE — Progress Notes (Addendum)
ANTICOAGULATION CONSULT NOTE - Initial Consult  Pharmacy Consult for coumadin Indication: VTE prophylaxis  No Known Allergies  Patient Measurements:     Vital Signs: Temp: 98 F (36.7 C) (11/12 1703) Temp src: Oral (11/12 1703) BP: 117/63 mmHg (11/12 1703) Pulse Rate: 63  (11/12 1703)  Labs: No results found for this basename: HGB:2,HCT:3,PLT:3,APTT:3,LABPROT:3,INR:3,HEPARINUNFRC:3,CREATININE:3,CKTOTAL:3,CKMB:3,TROPONINI:3 in the last 72 hours  CrCl is unknown because there is no height on file for the current visit.   Medical History: Past Medical History  Diagnosis Date  . Hyperlipidemia   . Anxiety   . Pneumonia     "as a baby"  . Bronchitis     hx of  . Diabetes mellitus without complication     on oral medicine  . GERD (gastroesophageal reflux disease)   . H/O hiatal hernia   . Arthritis   . Cataracts, bilateral   . Hypertension     Dr. Johnn Hai     Medications:  Scheduled:    . amLODipine-benazepril  2 capsule Oral Daily  . aspirin EC  81 mg Oral Daily  .  ceFAZolin (ANCEF) IV  1 g Intravenous Q6H  . [COMPLETED]  ceFAZolin (ANCEF) IV  2 g Intravenous 60 min Pre-Op  . [COMPLETED] cloNIDine  150 mcg Intra-articular To OR  . docusate sodium  100 mg Oral BID  . hydrochlorothiazide  25 mg Oral Daily  . HYDROcodone-acetaminophen  1 tablet Oral Q6H  . HYDROmorphone      . insulin aspart  0-15 Units Subcutaneous TID WC  . linagliptin  5 mg Oral Daily  . morphine   Intravenous Q4H  . morphine      . pantoprazole  80 mg Oral Q1200  . simvastatin  40 mg Oral QHS  . Vitamin D  2,000 Units Oral Daily   Infusions:    . 0.45 % NaCl with KCl 20 mEq / L    . [DISCONTINUED] lactated ringers 50 mL/hr at 03/31/12 1058    Assessment: 66 yo female s/p ortho surgery will be put on coumadin for VTE prophylaxis.  Was not on coumadin prior to admission.  Coumadin score = 2.  Baseline INR is 1.06. Goal of Therapy:  INR 2-3    Plan:  1) Coumadin 5mg  po  x1 2) Follow up on stat INR 3) Daily PT/INR  Donna Wu, Donna Wu 03/31/2012,5:25 PM

## 2012-03-31 NOTE — Anesthesia Preprocedure Evaluation (Addendum)
Anesthesia Evaluation  Patient identified by MRN, date of birth, ID band Patient awake    Reviewed: Allergy & Precautions, H&P , NPO status , Patient's Chart, lab work & pertinent test results  History of Anesthesia Complications Negative for: history of anesthetic complications  Airway Mallampati: I TM Distance: >3 FB Neck ROM: Full    Dental  (+) Edentulous Upper, Poor Dentition and Dental Advisory Given   Pulmonary neg pulmonary ROS, pneumonia -,  Pneumonia @ 6 months old breath sounds clear to auscultation        Cardiovascular hypertension, Pt. on medications Rhythm:Regular Rate:Normal     Neuro/Psych Anxiety    GI/Hepatic Neg liver ROS, hiatal hernia, GERD-  Medicated and Controlled,  Endo/Other  diabetes, Well Controlled, Type 2, Oral Hypoglycemic Agents  Renal/GU negative Renal ROS     Musculoskeletal   Abdominal (+) + obese,   Peds  Hematology   Anesthesia Other Findings   Reproductive/Obstetrics                          Anesthesia Physical Anesthesia Plan  ASA: II  Anesthesia Plan: General   Post-op Pain Management:    Induction: Intravenous  Airway Management Planned: Oral ETT  Additional Equipment:   Intra-op Plan:   Post-operative Plan: Extubation in OR  Informed Consent: I have reviewed the patients History and Physical, chart, labs and discussed the procedure including the risks, benefits and alternatives for the proposed anesthesia with the patient or authorized representative who has indicated his/her understanding and acceptance.   Dental advisory given  Plan Discussed with: CRNA and Surgeon  Anesthesia Plan Comments:         Anesthesia Quick Evaluation

## 2012-03-31 NOTE — Brief Op Note (Signed)
03/31/2012  2:32 PM  PATIENT:  Donna Wu  66 y.o. female  PRE-OPERATIVE DIAGNOSIS:  Left knee osteoarthritis  POST-OPERATIVE DIAGNOSIS:  Left knee osteoarthritis  PROCEDURE:  Procedure(s): TOTAL KNEE ARTHROPLASTY  SURGEON:  Surgeon(s): Cammy Copa, MD  ASSISTANT: S vernon  ANESTHESIA:   general  EBL: 100 ml    Total I/O In: 1400 [I.V.:1400] Out: 215 [Urine:140; Blood:75]  BLOOD ADMINISTERED: none  DRAINS: (left) Hemovact drain(s) in the knee with  Suction Clamped   LOCAL MEDICATIONS USED:  none  SPECIMEN:  No Specimen  COUNTS:  YES  TOURNIQUET:   Total Tourniquet Time Documented: Thigh (Left) - 92 minutes  DICTATION: .Other Dictation: Dictation Number [id number]  PLAN OF CARE: Admit to inpatient   PATIENT DISPOSITION:  PACU - hemodynamically stable

## 2012-03-31 NOTE — Brief Op Note (Cosign Needed)
03/31/2012  2:43 PM  PATIENT:  Donna Wu  66 y.o. female  PRE-OPERATIVE DIAGNOSIS:  Left knee osteoarthritis  POST-OPERATIVE DIAGNOSIS:  Left knee osteoarthritis  PROCEDURE:  Procedure(s) (LRB) with comments: TOTAL KNEE ARTHROPLASTY (Left) - Left total knee arthroplasty  SURGEON:  Surgeon(s) and Role:    * Cammy Copa, MD - Primary  PHYSICIAN ASSISTANT: Maud Deed [facility]  ASSISTANTS: none   ANESTHESIA:   general  EBL:  Total I/O In: 2000 [I.V.:2000] Out: 215 [Urine:140; Blood:75]  BLOOD ADMINISTERED:none  DRAINS: (1) Hemovact drain(s) in the left knee with  Suction Clamped   LOCAL MEDICATIONS USED:  MARCAINE    and  morphine and clonadine  SPECIMEN:  No Specimen  DISPOSITION OF SPECIMEN:  N/A  COUNTS:  YES  TOURNIQUET:   Total Tourniquet Time Documented: Thigh (Left) - 92 minutes  DICTATION: .Note written in EPIC  PLAN OF CARE: Admit to inpatient   PATIENT DISPOSITION:  PACU - hemodynamically stable.   Delay start of Pharmacological VTE agent (>24hrs) due to surgical blood loss or risk of bleeding: yes

## 2012-04-01 ENCOUNTER — Encounter (HOSPITAL_COMMUNITY): Payer: Self-pay | Admitting: Orthopedic Surgery

## 2012-04-01 LAB — GLUCOSE, CAPILLARY: Glucose-Capillary: 129 mg/dL — ABNORMAL HIGH (ref 70–99)

## 2012-04-01 LAB — CBC
Hemoglobin: 8.1 g/dL — ABNORMAL LOW (ref 12.0–15.0)
MCHC: 32.7 g/dL (ref 30.0–36.0)
Platelets: 174 10*3/uL (ref 150–400)
RBC: 3.18 MIL/uL — ABNORMAL LOW (ref 3.87–5.11)

## 2012-04-01 LAB — BASIC METABOLIC PANEL
GFR calc non Af Amer: >90 mL/min (ref >90)
Glucose, Bld: 116 mg/dL — ABNORMAL HIGH (ref 70–99)
Potassium: 3.2 mEq/L — ABNORMAL LOW (ref 3.5–5.1)
Sodium: 138 mEq/L (ref 135–145)

## 2012-04-01 LAB — PROTIME-INR
INR: 1.11 (ref 0.00–1.49)
Prothrombin Time: 14.2 seconds (ref 11.6–15.2)

## 2012-04-01 MED ORDER — WARFARIN SODIUM 5 MG PO TABS
5.0000 mg | ORAL_TABLET | Freq: Once | ORAL | Status: AC
  Administered 2012-04-01: 5 mg via ORAL
  Filled 2012-04-01 (×2): qty 1

## 2012-04-01 MED ORDER — WARFARIN VIDEO: Freq: Once | Status: DC

## 2012-04-01 MED ORDER — COUMADIN BOOK
Freq: Once | Status: DC
  Filled 2012-04-01: qty 1

## 2012-04-01 NOTE — Progress Notes (Signed)
Orthopedic Tech Progress Note Patient Details:  Donna Wu 1945-08-04 [id number]  Patient ID: [name], female   DOB: January 07, 1946, 66 y.o.   MRN: [id number] Put pt's left leg on cpm@40  degrees @ 1910  Nikki Dom 04/01/2012, 8:02 PM

## 2012-04-01 NOTE — Clinical Social Work Placement (Signed)
Clinical Social Work Department CLINICAL SOCIAL WORK PLACEMENT NOTE 04/01/2012  Patient:  Donna Wu, Donna Wu  Account Number:  [id number] Admit date:  03/31/2012  Clinical Social Worker:  Demika Langenderfer Lubertha Basque  Date/time:  04/01/2012 01:00 PM  Clinical Social Work is seeking post-discharge placement for this patient at the following level of care:   SKILLED NURSING   (*CSW will update this form in Epic as items are completed)   04/01/2012  Patient/family provided with [facility] Department of Clinical Social Work's list of facilities offering this level of care within the geographic area requested by the patient (or if unable, by the patient's family).  04/01/2012  Patient/family informed of their freedom to choose among providers that offer the needed level of care, that participate in Medicare, Medicaid or managed care program needed by the patient, have an available bed and are willing to accept the patient.  04/01/2012  Patient/family informed of [facility]' ownership interest in [facility], as well as of the fact that they are under no obligation to receive care at this facility.  PASARR submitted to EDS on  PASARR number received from EDS on   FL2 transmitted to all facilities in geographic area requested by pt/family on  04/01/2012 FL2 transmitted to all facilities within larger geographic area on 04/01/2012  Patient informed that his/her managed care company has contracts with or will negotiate with  certain facilities, including the following:     Patient/family informed of bed offers received:   Patient chooses bed at  Physician recommends and patient chooses bed at    Patient to be transferred to  on   Patient to be transferred to facility by   The following physician request were entered in Epic:   Additional Comments:  Sabino Niemann, MSW, [employer] [phone]

## 2012-04-01 NOTE — Evaluation (Signed)
Occupational Therapy Evaluation Patient Details Name: Donna Wu MRN: [id number] DOB: 18-Sep-1945 Today's Date: 04/01/2012 Time: 4098-1191 OT Time Calculation (min): 25 min  OT Assessment / Plan / Recommendation Clinical Impression  [name] s/p left TKA. Pt with increased heart rate during session. Would benefit from OT acutely to increase independence with ADL's     OT Assessment  Patient needs continued OT Services    Follow Up Recommendations  SNF    Barriers to Discharge      Equipment Recommendations  Tub/shower bench    Recommendations for Other Services    Frequency  Min 2X/week    Precautions / Restrictions Precautions Precautions: Knee Required Braces or Orthoses: Knee Immobilizer - Left Restrictions Weight Bearing Restrictions: Yes LLE Weight Bearing: Weight bearing as tolerated   Pertinent Vitals/Pain 9/10 Pain, pt stated she had just received pain meds 10 mins prior to session     ADL  Grooming: Performed;Denture care;Supervision/safety Where Assessed - Grooming: Unsupported sitting Toilet Transfer: Performed;Minimal assistance Toilet Transfer Method: Sit to Barista: Raised toilet seat with arms (or 3-in-1 over toilet) Equipment Used: Gait belt;Knee Immobilizer;Rolling walker Transfers/Ambulation Related to ADLs: min (A) during transfers with guarding during ambulation ADL Comments: Pt is min (A) with LLE during bed mobility. Pt ambulated to bathroom to complete denture care at sink. HR up to 139 had pt take seated rest break to complete task, HR went down to 129. Pt ambulated back to bed and positioned supine in bed with head slightly elevated HR down to 121. Pt stated she felt fine throughout session.      OT Diagnosis: Generalized weakness;Acute pain  OT Problem List: Decreased activity tolerance;Decreased knowledge of use of DME or AE;Decreased safety awareness;Decreased knowledge of precautions;Pain OT Treatment  Interventions: Self-care/ADL training;DME and/or AE instruction;Patient/family education;Therapeutic activities   OT Goals Acute Rehab OT Goals OT Goal Formulation: With patient Time For Goal Achievement: 04/15/12 Potential to Achieve Goals: Good ADL Goals Pt Will Perform Grooming: with supervision;Standing at sink ADL Goal: Grooming - Progress: Goal set today Pt Will Perform Lower Body Bathing: with min assist;Sit to stand from chair;Sit to stand from bed ADL Goal: Lower Body Bathing - Progress: Goal set today Pt Will Perform Lower Body Dressing: with min assist;Sit to stand from bed;with adaptive equipment ADL Goal: Lower Body Dressing - Progress: Goal set today Pt Will Transfer to Toilet: with supervision;Ambulation;Raised toilet seat with arms ADL Goal: Toilet Transfer - Progress: Goal set today Pt Will Perform Toileting - Clothing Manipulation: with supervision;Sitting on 3-in-1 or toilet ADL Goal: Toileting - Clothing Manipulation - Progress: Goal set today Pt Will Perform Toileting - Hygiene: with supervision;Sit to stand from 3-in-1/toilet ADL Goal: Toileting - Hygiene - Progress: Goal set today Miscellaneous OT Goals Miscellaneous OT Goal #1: Pt will be mod I with bed mobility as precursor to EOB ADL's OT Goal: Miscellaneous Goal #1 - Progress: Goal set today  Visit Information  Last OT Received On: 04/01/12 Assistance Needed: +1    Subjective Data  Subjective: Im sorry, Im just in pain Patient Stated Goal: To get more rehab prior to going home   Prior Functioning     Home Living Lives With: Daughter Available Help at Discharge: Other (Comment);Family (daughter works day shift (9-5)) Type of Home: House Home Access: Level entry Home Layout: Two level;Bed/bath upstairs Alternate Level Stairs-Number of Steps: 14 Alternate Level Stairs-Rails: Right Bathroom Shower/Tub: Tub/shower unit;Curtain Bathroom Toilet: Standard Bathroom Accessibility: Yes How Accessible:  Accessible  via walker Home Adaptive Equipment: Bedside commode/3-in-1;Walker - rolling Prior Function Level of Independence: Independent Able to Take Stairs?: Yes Driving: Yes Vocation: Retired Musician: No difficulties Dominant Hand: Right         Vision/Perception     Cognition  Overall Cognitive Status: Appears within functional limits for tasks assessed/performed Arousal/Alertness: Awake/alert Orientation Level: Appears intact for tasks assessed Behavior During Session: [facility] for tasks performed    Extremity/Trunk Assessment Right Upper Extremity Assessment RUE ROM/Strength/Tone: Within functional levels Left Upper Extremity Assessment LUE ROM/Strength/Tone: Within functional levels Trunk Assessment Trunk Assessment: Normal     Mobility Bed Mobility Bed Mobility: Supine to Sit;Sitting - Scoot to [place] of Bed;Sit to Supine;Scooting to [facility] Supine to Sit: 4: Min assist;HOB elevated Sitting - Scoot to Edge of Bed: 4: Min assist;With rail Sit to Supine: 4: Min guard;HOB flat Scooting to HOB: 4: Min assist;With rail Details for Bed Mobility Assistance: (A) with supporting LLE and cues for sequencing.  Transfers Transfers: Sit to Stand;Stand to Sit Sit to Stand: 4: Min assist;With upper extremity assist;From bed;From chair/3-in-1 Stand to Sit: 4: Min assist;With upper extremity assist;To bed;To chair/3-in-1 Details for Transfer Assistance: Cues for safe handplacement, pt tends to pull on RW to perform transfers.                     End of Session OT - End of Session Equipment Utilized During Treatment: Gait belt;Left knee immobilizer Activity Tolerance: Patient tolerated treatment well Patient left: in bed;with call bell/phone within reach Nurse Communication: Mobility status;Other (comment) (Heart rate)  GO     Donna Wu 04/01/2012, 3:39 PM

## 2012-04-01 NOTE — Op Note (Signed)
NAMELARKYN, GREENBERGER NO.:  [id number]  MEDICAL RECORD NO.:  [id number]  LOCATION:  5N28C                        FACILITY:  [facility]  PHYSICIAN:  Burnard Bunting, M.D.    DATE OF BIRTH:  1945/11/12  DATE OF PROCEDURE: DATE OF DISCHARGE:                              OPERATIVE REPORT   PREOPERATIVE DIAGNOSIS:  Left knee arthritis.  POSTOPERATIVE DIAGNOSIS:  Left knee arthritis.  PROCEDURE:  Left total knee replacement using Stryker PCL sacrificing size 3 femur, 3 tibia, 11 poly insert, 32 patella.  SURGEON:  Burnard Bunting, MD  ASSISTANT:  Wende Neighbors, PA  ANESTHESIA:  General endotracheal.  ESTIMATED BLOOD LOSS:  100 mL.  DRAINS:  Hemovac x1.  TOURNIQUET TIME:  92 minutes at 300 mmHg.  INDICATIONS:  Donna Wu is a patient with end-stage left knee arthritis presents for left total knee replacement after explanation of risks and benefits.  She has debilitating pain with ADLs and has failed conservative management.  PROCEDURE IN DETAIL:  The patient was brought to the operating room where general endotracheal anesthesia was induced.  Preoperative antibiotics were administered.  Time-out was called.  Left knee was prescrubbed with alcohol and Betadine, which was allowed to air dry, prepped with DuraPrep solution, draped in sterile manner. The knee was then covered with Ioban, draped in a sterile manner.  Left leg was elevated, exsanguinated with Esmarch wrap.  Tourniquet was inflated.  SCD was placed on the contralateral extremity.  Anterior approach to the knee was made. Skin and subcutaneous tissue sharply divided.  Median parapatellar approach was made.  Precise location marked with #1 Vicryl suture.  Soft tissue elevated off the distal anterior femur.  Lateral patellofemoral ligament was released.  Fat pad partially excised.  Osteophytes were removed.  The intramedullary alignment was then used to cut the tibia approximately 4 mm off the  most affected side.  In a similar fashion, a 10 mm cut was made on the distal femur after using intramedullary alignment.  A 4 in 1 cut was made with collaterals protected.  Box cut was then performed.  Menisci were removed.  Trial was placed.  Rotational alignment of the tibial base plate was made and marked.  Tibia was keel punched.  The patella was then cut freehand from 20 to 13.  Trial button was placed along with a size 3 femur, 3 tibia, and 11 poly.  Excellent tracking, good stability of varus and valgus stress at 0, 30, and 90 degrees was achieved.  Full extension was achieved.  At this time, trial components were removed, 3 L of irrigating solution were used.  True components were then placed with same stability parameters maintained.  The patient had excellent patellar tracking with no thumbs technique.  The tourniquet was then released.  Bleeding points encountered and controlled with electrocautery.  Incision was then irrigated and again closed over bolster using #1 Vicryl suture, 0 Vicryl suture, 2-0 Vicryl suture, and staples.  Hemovac drain was placed.  Solution of Marcaine, morphine, and clonidine injected into the knee.  Bulky wrap and knee immobilizer were applied.  [name]'s assistance was required at all times during the case for opening,  closing, limb positioning.  Her assistance was a medical necessity.     Burnard Bunting, M.D.     GSD/MEDQ  D:  03/31/2012  T:  04/01/2012  Job:  [id number]

## 2012-04-01 NOTE — Evaluation (Addendum)
Physical Therapy Evaluation Patient Details Name: Donna Wu MRN: [id number] DOB: 04-24-1946 Today's Date: 04/01/2012 Time: 0865-7846 PT Time Calculation (min): 57 min  PT Assessment / Plan / Recommendation Clinical Impression  Pt is a 66 y/o female s/p L TKA. Pt lives with her daughter, son in law and 78 month old grand daughter in a two story home with no bedroom on the first level. Pt's family would be unavailable to assist pt untill after 6pm. Pt would benefit from short term SNF placement to maximize functional potential for safe return to home. Acute PT to follow pt to initiate rehab process.     PT Assessment  Patient needs continued PT services    Follow Up Recommendations  SNF;Supervision/Assistance - 24 hour    Does the patient have the potential to tolerate intense rehabilitation      Barriers to Discharge Inaccessible home environment;Decreased caregiver support Family unavailable until after 6 pm and two level home with full fligtht of stairs to pt's bedroom/bathroom.     Equipment Recommendations  None recommended by PT    Recommendations for Other Services OT consult   Frequency 7X/week    Precautions / Restrictions Precautions Precautions: Knee Required Braces or Orthoses: Knee Immobilizer - Left Restrictions Weight Bearing Restrictions: Yes LLE Weight Bearing: Weight bearing as tolerated   Pertinent Vitals/Pain 5/10 pain in knee.  Pt medicated prior to session.       Mobility  Bed Mobility Bed Mobility: Supine to Sit;Sitting - Scoot to Edge of Bed Supine to Sit: 4: Min assist;HOB elevated Supine to Sit: Patient Percentage: 80% Sitting - Scoot to Edge of Bed: 4: Min assist;With rail Details for Bed Mobility Assistance: Assist for L LE cues for technique to contralateral side of bed.  Transfers Transfers: Sit to Stand;Stand to Sit Sit to Stand: 3: Mod assist;From bed;With upper extremity assist Sit to Stand: Patient Percentage: 70% Stand to Sit:  3: Mod assist;With upper extremity assist;To chair/3-in-1 Stand to Sit: Patient Percentage: 70% Details for Transfer Assistance: Verbal and tactile cues for hand and L LE placement.  Assist to position L LE to sit ( pt unable to slide foot forward). Assist for controlled descent to recliner.   Ambulation/Gait Ambulation/Gait Assistance: 4: Min assist Ambulation Distance (Feet): 10 Feet Assistive device: Rolling walker Ambulation/Gait Assistance Details: Verbal and visual cues for gait sequencing. Verbal cues for increased WB on L LE and decrease dependence on UEs. Manual facilitation for walker management Gait Pattern: Step-to pattern;Decreased stance time - left;Decreased hip/knee flexion - right;Decreased weight shift to left;Decreased step length - left;Decreased step length - right;Narrow base of support Gait velocity: Slow  General Gait Details: Pt wearing KI on L LE.   Initially fearful of placing wt on L LE but pt applying at least 70% of weight on L LE by end of trial.   Stairs: No Wheelchair Mobility Wheelchair Mobility: No    Shoulder Instructions     Exercises Total Joint Exercises Ankle Circles/Pumps: Both;10 reps;Seated   PT Diagnosis: Difficulty walking;Abnormality of gait;Acute pain;Generalized weakness  PT Problem List: Decreased strength;Decreased range of motion;Decreased mobility;Decreased activity tolerance;Decreased knowledge of use of DME;Obesity;Pain PT Treatment Interventions: Gait training;DME instruction;Functional mobility training;Therapeutic activities;Therapeutic exercise;Neuromuscular re-education;Patient/family education;Manual techniques   PT Goals Acute Rehab PT Goals PT Goal Formulation: With patient Time For Goal Achievement: 04/08/12 Potential to Achieve Goals: Good Pt will go Supine/Side to Sit: with supervision;with HOB 0 degrees PT Goal: Supine/Side to Sit - Progress: Goal set today Pt will go  Sit to Supine/Side: with supervision;with HOB 0  degrees PT Goal: Sit to Supine/Side - Progress: Goal set today Pt will go Sit to Stand: with supervision;with upper extremity assist PT Goal: Sit to Stand - Progress: Goal set today Pt will go Stand to Sit: with supervision;with upper extremity assist PT Goal: Stand to Sit - Progress: Goal set today Pt will Ambulate: 51 - 150 feet;with supervision;with rolling walker PT Goal: Ambulate - Progress: Goal set today Pt will Perform Home Exercise Program: with min assist PT Goal: Perform Home Exercise Program - Progress: Goal set today  Visit Information  Last PT Received On: 04/01/12 Assistance Needed: +1    Subjective Data  Subjective: Agree to PT eval  Patient Stated Goal: Walk without pain   Prior Functioning  Home Living Lives With: Daughter Available Help at Discharge: Other (Comment);Family (Daughter works day shift. (9-5)) Type of Home: House Home Access: Level entry Home Layout: Two level;Bed/bath upstairs Alternate Level Stairs-Number of Steps: 14 Alternate Level Stairs-Rails: Right Bathroom Shower/Tub: Tub/shower unit;Curtain Bathroom Toilet: Standard Bathroom Accessibility: Yes How Accessible: Accessible via walker Home Adaptive Equipment: Bedside commode/3-in-1;Walker - rolling Prior Function Level of Independence: Independent Able to Take Stairs?: Yes Driving: Yes Vocation: Retired Musician: No difficulties    Cognition  Overall Cognitive Status: Appears within functional limits for tasks assessed/performed Arousal/Alertness: Awake/alert Orientation Level: Appears intact for tasks assessed Behavior During Session: [facility] for tasks performed    Extremity/Trunk Assessment Right Upper Extremity Assessment RUE ROM/Strength/Tone: Within functional levels RUE Sensation: WFL - Light Touch;WFL - Proprioception RUE Coordination: WFL - gross motor;WFL - gross/fine motor Left Upper Extremity Assessment LUE ROM/Strength/Tone: Within functional  levels LUE Sensation: WFL - Light Touch;WFL - Proprioception Right Lower Extremity Assessment RLE ROM/Strength/Tone: WFL for tasks assessed RLE Sensation: WFL - Light Touch;WFL - Proprioception Left Lower Extremity Assessment LLE ROM/Strength/Tone: Unable to fully assess;Due to pain LLE Sensation: WFL - Light Touch;WFL - Proprioception Trunk Assessment Trunk Assessment: Normal   Balance Balance Balance Assessed: No  End of Session PT - End of Session Equipment Utilized During Treatment: Gait belt;Left knee immobilizer Activity Tolerance: Patient tolerated treatment well Patient left: in chair;with call bell/phone within reach;with nursing in room Nurse Communication: Mobility status  GP     Alferd Apa 04/01/12  10:00am  Danilyn Cocke L. Sevilla Murtagh DPT [phone]

## 2012-04-01 NOTE — Progress Notes (Signed)
ANTICOAGULATION CONSULT NOTE - Follow Up Consult  Pharmacy Consult for Coumadin Indication: VTE prophylaxis  No Known Allergies  Patient Measurements: Weight 82.2 kg on 03/24/12 Height 160 cm on 03/24/12  Vital Signs: Temp: 98.9 F (37.2 C) (11/13 0701) BP: 116/51 mmHg (11/13 0701) Pulse Rate: 85  (11/13 0701)  Labs:  Basename 04/01/12 0610 03/31/12 1810  HGB 8.1* --  HCT 24.8* --  PLT 174 --  APTT -- --  LABPROT 14.2 13.7  INR 1.11 1.06  HEPARINUNFRC -- --  CREATININE 0.66 --  CKTOTAL -- --  CKMB -- --  TROPONINI -- --   CrCl is unknown because there is no height on file for the current visit.  Medications:  Scheduled:    . amLODipine  10 mg Oral Daily   And  . benazepril  80 mg Oral Daily  . aspirin EC  81 mg Oral Daily  . [COMPLETED]  ceFAZolin (ANCEF) IV  1 g Intravenous Q6H  . [COMPLETED]  ceFAZolin (ANCEF) IV  2 g Intravenous 60 min Pre-Op  . cholecalciferol  2,000 Units Oral Daily  . [COMPLETED] cloNIDine  150 mcg Intra-articular To OR  . docusate sodium  100 mg Oral BID  . hydrochlorothiazide  25 mg Oral Daily  . HYDROcodone-acetaminophen  1 tablet Oral Q6H  . [EXPIRED] HYDROmorphone      . insulin aspart  0-15 Units Subcutaneous TID WC  . linagliptin  5 mg Oral Q breakfast  . morphine   Intravenous Q4H  . [EXPIRED] morphine      . pantoprazole  80 mg Oral Q1200  . pneumococcal 23 valent vaccine  0.5 mL Intramuscular Tomorrow-1000  . simvastatin  40 mg Oral QHS  . [COMPLETED] warfarin  5 mg Oral Once  . Warfarin - Pharmacist Dosing Inpatient   Does not apply q1800  . [DISCONTINUED] amLODipine-benazepril  2 capsule Oral Daily  . [DISCONTINUED] Vitamin D  2,000 Units Oral Daily   Assessment: 19 YOF admitted with end-stage osteoarthritis for TKA of left knee now post-op day #1 on Coumadin for VTE prophylaxis. Coumadin score is low at 2. INR today is 1.11- up from baseline INR of 1.06 after 5mg  of Coumadin given on 11/12.  Hgb is down some today at 8.1  from 11.8 on 03/24/12. 100cc bood loss recorded on operative report. No further bleeding documented. Patient is not on significant potentiators of Coumadin.   Goal of Therapy:  INR 2-3   Plan:  1. Coumadin 5mg  po x1 again tonight. 2. Follow-up Hgb closely. 3. Follow-up daily INR trend as new start Coumadin.  4. Coumadin book and video to initiate education.   Link Snuffer, PharmD, BCPS Clinical Pharmacist [phone] 04/01/2012,9:47 AM

## 2012-04-01 NOTE — Clinical Social Work Psychosocial (Signed)
Clinical Social Work Department BRIEF PSYCHOSOCIAL ASSESSMENT 04/01/2012  Patient:  Donna Wu, Donna Wu     Account Number:  [id number]     Admit date:  03/31/2012  Clinical Social Worker:  Juliette Mangle  Date/Time:  04/01/2012 01:00 PM  Referred by:  Physician  Date Referred:  04/01/2012 Referred for  SNF Placement   Other Referral:   Interview type:  Patient Other interview type:    PSYCHOSOCIAL DATA Living Status:   Admitted from facility:   Level of care:   Primary support name:  Robertson,Cassandra Primary support relationship to patient:  CHILD, ADULT Degree of support available:   Strong    CURRENT CONCERNS Current Concerns  Post-Acute Placement   Other Concerns:    SOCIAL WORK ASSESSMENT / PLAN Clinical Social Worker received referral for the potential need for post acute placement at a SNF. CSW reviewed chart and met with patient at bedside. CSW introduced self, explained role, and provided emotional support. CSW discussed skilled nursing home placement, reviewed the process of placement and answered all the patient's questions. After reviewing the SNF list, Patient reported that she is agreeable to CSW seeking SNF placement. CSW encouraged patient to ask questions as needed of CSW and medical staff. CSW will begin the placement process and will continue to follow and assist with all d/c planning.   Assessment/plan status:  Psychosocial Support/Ongoing Assessment of Needs Other assessment/ plan:   Information/referral to community resources:   SNF choice list    PATIENT'S/FAMILY'S RESPONSE TO PLAN OF CARE: Patient was very appreciative of support and information provided by CSW. CSW will continue to follow and will assist with all d/c planning needs    Sabino Niemann, MSW, LCSWA [phone]

## 2012-04-01 NOTE — Evaluation (Signed)
I agree with the following treatment note after reviewing documentation.   Johnston, Marley Charlot Brynn   OTR/L Pager: [phone] Office: [phone] .   

## 2012-04-01 NOTE — Progress Notes (Signed)
UR COMPLETED  

## 2012-04-01 NOTE — Progress Notes (Signed)
CARE MANAGEMENT NOTE 04/01/2012  Patient:  YULISA, BOBBY   Account Number:  [id number]  Date Initiated:  04/01/2012  Documentation initiated by:  Vance Peper  Subjective/Objective Assessment:   66 yr old female s/p left total knee arthroplasty     Action/Plan:   Patient preoperatively setup with [facility], will need shortterm SNF for rehab Social Worker made aware.   Anticipated DC Date:  04/03/2012   Anticipated DC Plan:  SKILLED NURSING FACILITY  In-house referral  Clinical Social Worker      DC Planning Services  CM consult      Choice offered to / List presented to:             Status of service:  Completed, signed off Medicare Important Message given?   (If response is "NO", the following Medicare IM given date fields will be blank) Date Medicare IM given:   Date Additional Medicare IM given:    Discharge Disposition:  SKILLED NURSING FACILITY  Per UR Regulation:    If discussed at Long Length of Stay Meetings, dates discussed:    Comments:

## 2012-04-01 NOTE — Progress Notes (Signed)
Orthopedic Tech Progress Note Patient Details:  Donna Wu 06-30-1945 [id number]  Patient ID: Donna Wu, female   DOB: 04/09/1946, 66 y.o.   MRN: [id number] Adjusted cpm and put pt's left leg on cpm@0 -60 degrees@1900   Nikki Dom 04/01/2012, 7:10 PM

## 2012-04-01 NOTE — Progress Notes (Signed)
Subjective: Pt stable - pain controlled   Objective: Vital signs in last 24 hours: Temp:  [97 F (36.1 C)-98.9 F (37.2 C)] 98.9 F (37.2 C) (11/13 0701) Pulse Rate:  [63-101] 85  (11/13 0701) Resp:  [6-22] 12  (11/13 0800) BP: (108-121)/(51-81) 116/51 mmHg (11/13 0701) SpO2:  [98 %-100 %] 100 % (11/13 0800)  Intake/Output from previous day: 11/12 0701 - 11/13 0700 In: 2055 [I.V.:2000; IV Piggyback:55] Out: 715 [Urine:490; Drains:150; Blood:75] Intake/Output this shift: Total I/O In: -  Out: 400 [Urine:300; Drains:100]  Exam:  Neurovascular intact Sensation intact distally Intact pulses distally Dorsiflexion/Plantar flexion intact  Labs:  Basename 04/01/12 0610  HGB 8.1*    Basename 04/01/12 0610  WBC 8.1  RBC 3.18*  HCT 24.8*  PLT 174    Basename 04/01/12 0610  NA 138  K 3.2*  CL 103  CO2 28  BUN 11  CREATININE 0.66  GLUCOSE 116*  CALCIUM 8.6    Basename 04/01/12 0610 03/31/12 1810  LABPT -- --  INR 1.11 1.06    Assessment/Plan: Dc pca - hl iv - cpm - PT - oral pain meds - follow hgb   DEAN,GREGORY SCOTT 04/01/2012, 8:28 AM

## 2012-04-02 LAB — CBC
HCT: 22.8 % — ABNORMAL LOW (ref 36.0–46.0)
Hemoglobin: 7.7 g/dL — ABNORMAL LOW (ref 12.0–15.0)
MCV: 77.3 fL — ABNORMAL LOW (ref 78.0–100.0)
RDW: 14.1 % (ref 11.5–15.5)
WBC: 8.1 10*3/uL (ref 4.0–10.5)

## 2012-04-02 LAB — PROTIME-INR: INR: 1.44 (ref 0.00–1.49)

## 2012-04-02 LAB — GLUCOSE, CAPILLARY
Glucose-Capillary: 164 mg/dL — ABNORMAL HIGH (ref 70–99)
Glucose-Capillary: 112 mg/dL — ABNORMAL HIGH (ref 70–99)
Glucose-Capillary: 136 mg/dL — ABNORMAL HIGH (ref 70–99)

## 2012-04-02 MED ORDER — WARFARIN SODIUM 5 MG PO TABS
5.0000 mg | ORAL_TABLET | Freq: Once | ORAL | Status: AC
  Administered 2012-04-02: 5 mg via ORAL
  Filled 2012-04-02: qty 1

## 2012-04-02 MED ORDER — POTASSIUM CHLORIDE CRYS ER 10 MEQ PO TBCR
10.0000 meq | EXTENDED_RELEASE_TABLET | Freq: Three times a day (TID) | ORAL | Status: DC
  Administered 2012-04-02 – 2012-04-03 (×4): 10 meq via ORAL
  Filled 2012-04-02 (×7): qty 1

## 2012-04-02 NOTE — Progress Notes (Signed)
Bed offers given to patient this afternoon. She is not familiar with any of the SNF's and wants to discuss with her daughter this evening. She will let CSW know in the a.m. Which facility she has chosen.  Plan d/c to SNF tomorrow if medically stable per MD.  [facility] authorization has been requested. Patient is aware and is agreeable to d/c when stable.  Lorri Frederick. [place] Pugh  [phone]

## 2012-04-02 NOTE — Progress Notes (Signed)
Physical Therapy Treatment Patient Details Name: Donna Wu MRN: [id number] DOB: 02-Aug-1945 Today's Date: 04/02/2012 Time: 7829-5621 PT Time Calculation (min): 20 min  PT Assessment / Plan / Recommendation Comments on Treatment Session       Follow Up Recommendations  SNF;Supervision/Assistance - 24 hour     Does the patient have the potential to tolerate intense rehabilitation     Barriers to Discharge        Equipment Recommendations  None recommended by PT    Recommendations for Other Services    Frequency 7X/week   Plan Discharge plan remains appropriate;Frequency remains appropriate    Precautions / Restrictions Precautions Precautions: Knee Required Braces or Orthoses: Knee Immobilizer - Left Knee Immobilizer - Left: On except when in CPM Restrictions Weight Bearing Restrictions: Yes LLE Weight Bearing: Weight bearing as tolerated   Pertinent Vitals/Pain 6-8/10 pain in knee.  RN notified.  Grade 2 manual joint mobs and repositioning for pain relief.      Mobility  Bed Mobility Bed Mobility: Not assessed Transfers Transfers: Not assessed Ambulation/Gait Ambulation/Gait Assistance: Not tested (comment)    Exercises Total Joint Exercises Ankle Circles/Pumps: Both;10 reps;Seated Quad Sets: Left;10 reps Short Arc Quad: Left;10 reps;Supine Heel Slides: Left;10 reps   PT Diagnosis:    PT Problem List:   PT Treatment Interventions:     PT Goals Acute Rehab PT Goals PT Goal Formulation: With patient Time For Goal Achievement: 04/08/12 Potential to Achieve Goals: Good Pt will Perform Home Exercise Program: with min assist PT Goal: Perform Home Exercise Program - Progress: Progressing toward goal  Visit Information  Last PT Received On: 04/02/12 Assistance Needed: +1    Subjective Data  Subjective: I am hurting bad right now.  Patient Stated Goal: Walk without pain.  Be able to care for grandchild again.     Cognition  Overall Cognitive Status:  Appears within functional limits for tasks assessed/performed Arousal/Alertness: Awake/alert Orientation Level: Appears intact for tasks assessed Behavior During Session: [facility] for tasks performed    Balance     End of Session PT - End of Session Activity Tolerance: Patient limited by pain Patient left: in bed;with call bell/phone within reach Nurse Communication: Mobility status;Other (comment)   GP     Shealee Yordy 04/02/2012, 5:30 PM Less Woolsey L. Renatta Shrieves DPT [phone]

## 2012-04-02 NOTE — Progress Notes (Signed)
Physical Therapy Treatment Patient Details Name: Donna Wu MRN: [id number] DOB: Oct 01, 1945 Today's Date: 04/02/2012 Time: 4132-4401 PT Time Calculation (min): 26 min  PT Assessment / Plan / Recommendation Comments on Treatment Session  Pt progressing nicely    Follow Up Recommendations  SNF;Supervision/Assistance - 24 hour     Does the patient have the potential to tolerate intense rehabilitation     Barriers to Discharge        Equipment Recommendations  None recommended by PT    Recommendations for Other Services    Frequency 7X/week   Plan      Precautions / Restrictions Precautions Precautions: Knee Required Braces or Orthoses: Knee Immobilizer - Left Knee Immobilizer - Left: On except when in CPM Restrictions Weight Bearing Restrictions: Yes LLE Weight Bearing: Weight bearing as tolerated   Pertinent Vitals/Pain 4/10 pain in L LE.  Pt did not require intervention for pain at this time.      Mobility  Bed Mobility Bed Mobility: Supine to Sit;Sitting - Scoot to Edge of Bed Supine to Sit: 4: Min assist Supine to Sit: Patient Percentage: 90% Sitting - Scoot to Edge of Bed: Not tested (comment) Sit to Supine: 4: Min guard;HOB flat Details for Bed Mobility Assistance: Assist for L LE cues for technique to contralateral side of bed.  Transfers Transfers: Sit to Stand;Stand to Sit Sit to Stand: 4: Min guard;From chair/3-in-1;With upper extremity assist Stand to Sit: 4: Min guard;To bed;With upper extremity assist Details for Transfer Assistance: Verbal cueing for technique.  Ambulation/Gait Ambulation/Gait Assistance: 4: Min guard Ambulation Distance (Feet): 150 Feet Assistive device: Rolling walker Ambulation/Gait Assistance Details: Verbal and visual cues for gait sequencing. Verbal cues for increased WB on L LE and decrease dependence on UEs. Manual facilitation for walker management Gait Pattern: Step-to pattern;Decreased stance time - left;Decreased  hip/knee flexion - right;Decreased weight shift to left;Decreased step length - left;Decreased step length - right;Narrow base of support Gait velocity: improved Stairs: No Wheelchair Mobility Wheelchair Mobility: No    Exercises Total Joint Exercises Ankle Circles/Pumps: Both;10 reps;Seated Goniometric ROM: 0-60 degrees AAROM   PT Diagnosis:    PT Problem List:   PT Treatment Interventions:     PT Goals Acute Rehab PT Goals PT Goal Formulation: With patient Time For Goal Achievement: 04/08/12 Potential to Achieve Goals: Good Pt will go Supine/Side to Sit: with supervision;with HOB 0 degrees PT Goal: Supine/Side to Sit - Progress: Progressing toward goal Pt will go Sit to Supine/Side: with supervision;with HOB 0 degrees PT Goal: Sit to Supine/Side - Progress: Progressing toward goal Pt will go Sit to Stand: with supervision;with upper extremity assist PT Goal: Sit to Stand - Progress: Progressing toward goal Pt will go Stand to Sit: with supervision;with upper extremity assist PT Goal: Stand to Sit - Progress: Progressing toward goal Pt will Ambulate: with rolling walker;>150 feet;with modified independence PT Goal: Ambulate - Progress: Updated due to goal met Pt will Perform Home Exercise Program: with min assist  Visit Information  Last PT Received On: 04/02/12 Assistance Needed: +1    Subjective Data  Subjective: Knee feeling better Patient Stated Goal: Walk without pain.  Be able to care for grandchild again.     Cognition  Overall Cognitive Status: Appears within functional limits for tasks assessed/performed Arousal/Alertness: Awake/alert Orientation Level: Appears intact for tasks assessed Behavior During Session: [facility] for tasks performed    Balance  Balance Balance Assessed: No  End of Session PT - End of Session Equipment Utilized During  Treatment: Gait belt;Left knee immobilizer Activity Tolerance: Patient tolerated treatment well Patient left: in bed;with  call bell/phone within reach Nurse Communication: Mobility status;Other (comment) (Pt to go into CPM after lunch. )   GP     Kabella Cassidy 04/02/2012, 11:34 AM Theron Arista L. Trigg Delarocha DPT [phone]

## 2012-04-02 NOTE — Progress Notes (Signed)
Subjective: Pt stable - walks in room - no dizziness   Objective: Vital signs in last 24 hours: Temp:  [99.7 F (37.6 C)-100.1 F (37.8 C)] 100.1 F (37.8 C) (11/14 0650) Pulse Rate:  [76-105] 101  (11/14 0650) Resp:  [13-20] 18  (11/14 0650) BP: (110-148)/(48-59) 148/59 mmHg (11/14 0650) SpO2:  [98 %-100 %] 100 % (11/14 0650)  Intake/Output from previous day: 11/13 0701 - 11/14 0700 In: 480 [P.O.:480] Out: 800 [Urine:700; Drains:100] Intake/Output this shift:    Exam:  Neurovascular intact Sensation intact distally Intact pulses distally Dorsiflexion/Plantar flexion intact  Labs:  Basename 04/02/12 0538 04/01/12 0610  HGB 7.7* 8.1*    Basename 04/02/12 0538 04/01/12 0610  WBC 8.1 8.1  RBC 2.95* 3.18*  HCT 22.8* 24.8*  PLT 140* 174    Basename 04/01/12 0610  NA 138  K 3.2*  CL 103  CO2 28  BUN 11  CREATININE 0.66  GLUCOSE 116*  CALCIUM 8.6    Basename 04/02/12 0538 04/01/12 0610  LABPT -- --  INR 1.44 1.11    Assessment/Plan: Pt improving - possible dc to snf Friday - supplement k   DEAN,GREGORY SCOTT 04/02/2012, 8:01 AM

## 2012-04-02 NOTE — Progress Notes (Signed)
ANTICOAGULATION CONSULT NOTE - Follow Up Consult  Pharmacy Consult for Coumadin Indication: VTE prophylaxis  No Known Allergies  Patient Measurements: Weight 82.2 kg on 03/24/12  Height 160 cm on 03/24/12  Vital Signs: Temp: 100.1 F (37.8 C) (11/14 0650) BP: 124/55 mmHg (11/14 0831) Pulse Rate: 113  (11/14 0831)  Labs:  Basename 04/02/12 0538 04/01/12 0610 03/31/12 1810  HGB 7.7* 8.1* --  HCT 22.8* 24.8* --  PLT 140* 174 --  APTT -- -- --  LABPROT 17.2* 14.2 13.7  INR 1.44 1.11 1.06  HEPARINUNFRC -- -- --  CREATININE -- 0.66 --  CKTOTAL -- -- --  CKMB -- -- --  TROPONINI -- -- --   CrCl is unknown because there is no height on file for the current visit.  Medications:  Scheduled:    . amLODipine  10 mg Oral Daily   And  . benazepril  80 mg Oral Daily  . aspirin EC  81 mg Oral Daily  . cholecalciferol  2,000 Units Oral Daily  . coumadin book   Does not apply Once  . docusate sodium  100 mg Oral BID  . hydrochlorothiazide  25 mg Oral Daily  . HYDROcodone-acetaminophen  1 tablet Oral Q6H  . insulin aspart  0-15 Units Subcutaneous TID WC  . linagliptin  5 mg Oral Q breakfast  . morphine   Intravenous Q4H  . pantoprazole  80 mg Oral Q1200  . [COMPLETED] pneumococcal 23 valent vaccine  0.5 mL Intramuscular Tomorrow-1000  . potassium chloride  10 mEq Oral TID  . simvastatin  40 mg Oral QHS  . [COMPLETED] warfarin  5 mg Oral ONCE-1800  . warfarin   Does not apply Once  . 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 Coumadin score is low at 2. INR today is 1.44- up from baseline of 1.06 s/p 2 doses of 5mg  on 11/12 and 11/13. Patient is not on significant potentiators of Coumadin.   Hgb is down again today at 7.7. Platelets down at 140.  ~100cc blood loss recorded from surgery. No further bleeding reported. Will continue  to monitor closely.   Goal of Therapy:  INR 2-3   Plan:  1. Coumadin 5mg  po x1 again at 1800.  2. Follow-up CBC closely. Please alert pharmacy if any bleeding.  3. Follow-up INR daily.   Link Snuffer, PharmD, BCPS Clinical Pharmacist [phone] 04/02/2012,8:51 AM

## 2012-04-03 LAB — CBC
HCT: 21.5 % — ABNORMAL LOW (ref 36.0–46.0)
MCH: 25.4 pg — ABNORMAL LOW (ref 26.0–34.0)
MCHC: 33 g/dL (ref 30.0–36.0)
MCV: 77.1 fL — ABNORMAL LOW (ref 78.0–100.0)
Platelets: 149 10*3/uL — ABNORMAL LOW (ref 150–400)
RDW: 14.1 % (ref 11.5–15.5)
WBC: 7.2 10*3/uL (ref 4.0–10.5)

## 2012-04-03 LAB — GLUCOSE, CAPILLARY: Glucose-Capillary: 92 mg/dL (ref 70–99)

## 2012-04-03 MED ORDER — HYDROCODONE-ACETAMINOPHEN 7.5-325 MG PO TABS: 1.0000 | ORAL_TABLET | Freq: Four times a day (QID) | ORAL | Status: DC

## 2012-04-03 MED ORDER — DSS 100 MG PO CAPS: 100.0000 mg | ORAL_CAPSULE | Freq: Two times a day (BID) | ORAL | Status: DC

## 2012-04-03 MED ORDER — METHOCARBAMOL 500 MG PO TABS: 500.0000 mg | ORAL_TABLET | Freq: Four times a day (QID) | ORAL | Status: DC | PRN

## 2012-04-03 MED ORDER — WARFARIN SODIUM 5 MG PO TABS: 5.0000 mg | ORAL_TABLET | Freq: Every day | ORAL | Status: DC

## 2012-04-03 NOTE — Progress Notes (Signed)
Subjective: Pt stable - pain controlled - not dizzy when up and about   Objective: Vital signs in last 24 hours: Temp:  [98.6 F (37 C)-99.7 F (37.6 C)] 98.6 F (37 C) (11/15 0700) Pulse Rate:  [80-116] 80  (11/15 0700) Resp:  [17-18] 18  (11/15 0700) BP: (115-124)/(51-58) 124/58 mmHg (11/15 0700) SpO2:  [96 %-99 %] 99 % (11/15 0700)  Intake/Output from previous day: 11/14 0701 - 11/15 0700 In: 600 [P.O.:600] Out: -  Intake/Output this shift:    Exam:  Sensation intact distally Intact pulses distally Dorsiflexion/Plantar flexion intact  Labs:  Basename 04/03/12 0500 04/02/12 0538 04/01/12 0610  HGB 7.1* 7.7* 8.1*    Basename 04/03/12 0500 04/02/12 0538  WBC 7.2 8.1  RBC 2.79* 2.95*  HCT 21.5* 22.8*  PLT 149* 140*    Basename 04/01/12 0610  NA 138  K 3.2*  CL 103  CO2 28  BUN 11  CREATININE 0.66  GLUCOSE 116*  CALCIUM 8.6    Basename 04/03/12 0500 04/02/12 0538  LABPT -- --  INR 1.42 1.44    Assessment/Plan: FL@ signed - tx 1 u prbc - acute blood loss anemia - ok to transfer to snf today or am - dc summary done - dressing changed today incision ok   Donna Wu SCOTT 04/03/2012, 8:01 AM

## 2012-04-03 NOTE — Discharge Summary (Signed)
Physician Discharge Summary  Patient ID: Donna Wu MRN: [id number] DOB/AGE: [birth year] 66 y.o.  Admit date: 03/31/2012 Discharge date: 04/03/2012  Admission Diagnoses:  Knee arthritis  Discharge Diagnoses:  Same  Surgeries: Procedure(s): TOTAL KNEE ARTHROPLASTY on 03/31/2012   Consultants:    Discharged Condition: Stable  Hospital Course: Donna Wu is an 66 y.o. female who was admitted 03/31/2012 with a chief complaint of knee pain, and found to have a diagnosis of knee arthritis  They were brought to the operating room on 03/31/2012 and underwent the above named procedures. Tolerated it well. Transfused 1 u prbc for hgb 7.1 on POD 3. Was able to maneuver in room and hall before discharge.   Antibiotics given:  Anti-infectives     Start     Dose/Rate Route Frequency Ordered Stop   03/31/12 1745   ceFAZolin (ANCEF) IVPB 1 g/50 mL premix        1 g 100 mL/hr over 30 Minutes Intravenous Every 6 hours 03/31/12 1724 03/31/12 2356   03/30/12 1423   ceFAZolin (ANCEF) IVPB 2 g/50 mL premix        2 g 100 mL/hr over 30 Minutes Intravenous 60 min pre-op 03/30/12 1423 03/31/12 1140        .  Recent vital signs:  Filed Vitals:   04/03/12 0700  BP: 124/58  Pulse: 80  Temp: 98.6 F (37 C)  Resp: 18    Recent laboratory studies:  Results for orders placed during the hospital encounter of 03/31/12  GLUCOSE, CAPILLARY      Component Value Range   Glucose-Capillary 117 (*) 70 - 99 mg/dL  GLUCOSE, CAPILLARY      Component Value Range   Glucose-Capillary 127 (*) 70 - 99 mg/dL  PROTIME-INR      Component Value Range   Prothrombin Time 14.2  11.6 - 15.2 seconds   INR 1.11  0.00 - 1.49  CBC      Component Value Range   WBC 8.1  4.0 - 10.5 K/uL   RBC 3.18 (*) 3.87 - 5.11 MIL/uL   Hemoglobin 8.1 (*) 12.0 - 15.0 g/dL   HCT 29.5 (*) 62.1 - 30.8 %   MCV 78.0  78.0 - 100.0 fL   MCH 25.5 (*) 26.0 - 34.0 pg   MCHC 32.7  30.0 - 36.0 g/dL   RDW 65.7  84.6 - 96.2 %   Platelets 174  150 - 400 K/uL  BASIC METABOLIC PANEL      Component Value Range   Sodium 138  135 - 145 mEq/L   Potassium 3.2 (*) 3.5 - 5.1 mEq/L   Chloride 103  96 - 112 mEq/L   CO2 28  19 - 32 mEq/L   Glucose, Bld 116 (*) 70 - 99 mg/dL   BUN 11  6 - 23 mg/dL   Creatinine, Ser 9.52  0.50 - 1.10 mg/dL   Calcium 8.6  8.4 - 84.1 mg/dL   GFR calc non Af Amer >90  >90 mL/min   GFR calc Af Amer >90  >90 mL/min  PROTIME-INR      Component Value Range   Prothrombin Time 13.7  11.6 - 15.2 seconds   INR 1.06  0.00 - 1.49  GLUCOSE, CAPILLARY      Component Value Range   Glucose-Capillary 167 (*) 70 - 99 mg/dL  GLUCOSE, CAPILLARY      Component Value Range   Glucose-Capillary 129 (*) 70 - 99 mg/dL  GLUCOSE, CAPILLARY  Component Value Range   Glucose-Capillary 248 (*) 70 - 99 mg/dL   Comment 1 Notify RN     Comment 2 Documented in Chart    GLUCOSE, CAPILLARY      Component Value Range   Glucose-Capillary 104 (*) 70 - 99 mg/dL  PROTIME-INR      Component Value Range   Prothrombin Time 17.2 (*) 11.6 - 15.2 seconds   INR 1.44  0.00 - 1.49  CBC      Component Value Range   WBC 8.1  4.0 - 10.5 K/uL   RBC 2.95 (*) 3.87 - 5.11 MIL/uL   Hemoglobin 7.7 (*) 12.0 - 15.0 g/dL   HCT 16.1 (*) 09.6 - 04.5 %   MCV 77.3 (*) 78.0 - 100.0 fL   MCH 26.1  26.0 - 34.0 pg   MCHC 33.8  30.0 - 36.0 g/dL   RDW 40.9  81.1 - 91.4 %   Platelets 140 (*) 150 - 400 K/uL  GLUCOSE, CAPILLARY      Component Value Range   Glucose-Capillary 120 (*) 70 - 99 mg/dL  GLUCOSE, CAPILLARY      Component Value Range   Glucose-Capillary 164 (*) 70 - 99 mg/dL  GLUCOSE, CAPILLARY      Component Value Range   Glucose-Capillary 112 (*) 70 - 99 mg/dL   Comment 1 Documented in Chart     Comment 2 Notify RN    GLUCOSE, CAPILLARY      Component Value Range   Glucose-Capillary 136 (*) 70 - 99 mg/dL   Comment 1 Notify RN    PROTIME-INR      Component Value Range   Prothrombin Time 17.0 (*) 11.6 - 15.2 seconds   INR  1.42  0.00 - 1.49  CBC      Component Value Range   WBC 7.2  4.0 - 10.5 K/uL   RBC 2.79 (*) 3.87 - 5.11 MIL/uL   Hemoglobin 7.1 (*) 12.0 - 15.0 g/dL   HCT 78.2 (*) 95.6 - 21.3 %   MCV 77.1 (*) 78.0 - 100.0 fL   MCH 25.4 (*) 26.0 - 34.0 pg   MCHC 33.0  30.0 - 36.0 g/dL   RDW 08.6  57.8 - 46.9 %   Platelets 149 (*) 150 - 400 K/uL  GLUCOSE, CAPILLARY      Component Value Range   Glucose-Capillary 148 (*) 70 - 99 mg/dL  GLUCOSE, CAPILLARY      Component Value Range   Glucose-Capillary 114 (*) 70 - 99 mg/dL    Discharge Medications:     Medication List     As of 04/03/2012  8:12 AM    TAKE these medications         amLODipine-benazepril 5-40 MG per capsule   Commonly known as: LOTREL   Take 2 capsules by mouth daily.      aspirin EC 81 MG tablet   Take 81 mg by mouth daily.      DSS 100 MG Caps   Take 100 mg by mouth 2 (two) times daily.      esomeprazole 40 MG capsule   Commonly known as: NEXIUM   Take 40 mg by mouth daily before breakfast.      hydrochlorothiazide 25 MG tablet   Commonly known as: HYDRODIURIL   Take 25 mg by mouth daily.      HYDROcodone-acetaminophen 7.5-325 MG per tablet   Commonly known as: NORCO   Take 1 tablet by mouth every 6 (six) hours.  LORazepam 0.5 MG tablet   Commonly known as: ATIVAN   Take 0.5 mg by mouth 3 (three) times daily as needed. For anxiety      methocarbamol 500 MG tablet   Commonly known as: ROBAXIN   Take 1 tablet (500 mg total) by mouth every 6 (six) hours as needed.      ONGLYZA 5 MG Tabs tablet   Generic drug: saxagliptin HCl   Take 5 mg by mouth daily.      simvastatin 40 MG tablet   Commonly known as: ZOCOR   Take 40 mg by mouth at bedtime.      Vitamin D 2000 UNITS Caps   Take 2,000 Units by mouth daily.      warfarin 5 MG tablet   Commonly known as: COUMADIN   Take 1 tablet (5 mg total) by mouth daily.        Diagnostic Studies: Dg Chest 2 View  03/24/2012  *RADIOLOGY REPORT*  Clinical  Data: Preop for left knee replacement.  Hypertension. Nonsmoker.  CHEST - 2 VIEW  Comparison: None  Findings: Cardiomediastinal silhouette is within normal limits. The lungs are free of focal consolidations and pleural effusions. There are moderate degenerative changes in the spine, associated with scoliosis.  IMPRESSION: No evidence for acute cardiopulmonary abnormality.   Original Report Authenticated By: Norva Pavlov, M.D.     Disposition: Final discharge disposition not confirmed      Discharge Orders    Future Orders Please Complete By Expires   Diet - low sodium heart healthy      Call MD / Call 911      Comments:   If you experience chest pain or shortness of breath, CALL 911 and be transported to the hospital emergency room.  If you develope a fever above 101 F, pus (white drainage) or increased drainage or redness at the wound, or calf pain, call your surgeon's office.   Constipation Prevention      Comments:   Drink plenty of fluids.  Prune juice may be helpful.  You may use a stool softener, such as Colace (over the counter) 100 mg twice a day.  Use MiraLax (over the counter) for constipation as needed.   Increase activity slowly as tolerated      Discharge instructions      Comments:   1. Weight bearing as tolerated left leg 2.CPM 2 hours per 8 3.Keep incision dry      Follow-up Information    Follow up with Cammy Copa, MD. Schedule an appointment as soon as possible for a visit in 2 weeks.   Contact information:   [address] [place] [address] [phone]           Signed: Cammy Copa 04/03/2012, 8:12 AM

## 2012-04-03 NOTE — Progress Notes (Signed)
Clinical social worker assisted with patient discharge to skilled nursing facility, [facility].  CSW addressed all family questions and concerns. CSW copied chart and added all important documents. CSW also set up patient transportation with [facility]. Clinical Social Worker will sign off for now as social work intervention is no longer needed.   Ricki Clack, MSW, LCSWA [phone] 

## 2012-04-03 NOTE — Progress Notes (Signed)
Physical Therapy Treatment Patient Details Name: Chenise Perilli MRN: [id number] DOB: November 08, 1945 Today's Date: 04/03/2012 Time: 4098-1191 PT Time Calculation (min): 25 min  PT Assessment / Plan / Recommendation Comments on Treatment Session       Follow Up Recommendations  SNF;Supervision/Assistance - 24 hour     Does the patient have the potential to tolerate intense rehabilitation     Barriers to Discharge        Equipment Recommendations  None recommended by PT    Recommendations for Other Services    Frequency 7X/week   Plan Discharge plan remains appropriate;Frequency remains appropriate    Precautions / Restrictions Precautions Precautions: Knee Required Braces or Orthoses: Knee Immobilizer - Left Knee Immobilizer - Left: On except when in CPM Restrictions Weight Bearing Restrictions: Yes LLE Weight Bearing: Weight bearing as tolerated   Pertinent Vitals/Pain Pt c/o 5/10 pain in knee.  Pt medicated prior to session.     Mobility  Bed Mobility Bed Mobility: Not assessed Transfers Transfers: Not assessed Ambulation/Gait Ambulation/Gait Assistance: Not tested (comment)    Exercises Total Joint Exercises Ankle Circles/Pumps: Both;10 reps;Supine Quad Sets: Left;10 reps Short Arc Quad: Left;10 reps;Supine Heel Slides: Left;10 reps Straight Leg Raises: 10 reps;AROM   PT Diagnosis:    PT Problem List:   PT Treatment Interventions:     PT Goals Acute Rehab PT Goals PT Goal Formulation: With patient Pt will Perform Home Exercise Program: with min assist PT Goal: Perform Home Exercise Program - Progress: Progressing toward goal  Visit Information  Last PT Received On: 04/03/12    Subjective Data      Cognition  Overall Cognitive Status: Appears within functional limits for tasks assessed/performed Arousal/Alertness: Awake/alert Orientation Level: Appears intact for tasks assessed Behavior During Session: [facility] for tasks performed    Balance     End  of Session PT - End of Session Activity Tolerance: Treatment limited secondary to medical complications (Comment) (pt receiving blood transfusion. ) Patient left: in bed;with call bell/phone within reach Nurse Communication: Mobility status;Other (comment)   GP     Givanni Staron 04/03/2012, 7:19 PM Emmalynne Courtney L. Lamarion Mcevers DPT [phone]

## 2012-04-04 LAB — TYPE AND SCREEN
Antibody Screen: NEGATIVE
Unit division: 0

## 2012-12-01 ENCOUNTER — Other Ambulatory Visit: Payer: Self-pay

## 2012-12-01 DIAGNOSIS — Z1231 Encounter for screening mammogram for malignant neoplasm of breast: Secondary | ICD-10-CM

## 2012-12-21 ENCOUNTER — Ambulatory Visit
Admission: RE | Admit: 2012-12-21 | Discharge: 2012-12-21 | Disposition: A | Discharge status: Home / Self Care | Payer: Medicare Other | Source: Ambulatory Visit

## 2012-12-21 DIAGNOSIS — Z1231 Encounter for screening mammogram for malignant neoplasm of breast: Secondary | ICD-10-CM

## 2013-01-12 ENCOUNTER — Encounter (HOSPITAL_BASED_OUTPATIENT_CLINIC_OR_DEPARTMENT_OTHER): Payer: Self-pay

## 2013-01-12 ENCOUNTER — Emergency Department (HOSPITAL_BASED_OUTPATIENT_CLINIC_OR_DEPARTMENT_OTHER)
Admission: EM | Admit: 2013-01-12 | Discharge: 2013-01-12 | Disposition: A | Discharge status: Home / Self Care | Payer: Medicare Other | Attending: Emergency Medicine | Admitting: Emergency Medicine

## 2013-01-12 DIAGNOSIS — M129 Arthropathy, unspecified: Secondary | ICD-10-CM | POA: Insufficient documentation

## 2013-01-12 DIAGNOSIS — Z8669 Personal history of other diseases of the nervous system and sense organs: Secondary | ICD-10-CM | POA: Insufficient documentation

## 2013-01-12 DIAGNOSIS — R51 Headache: Secondary | ICD-10-CM | POA: Insufficient documentation

## 2013-01-12 DIAGNOSIS — Z8719 Personal history of other diseases of the digestive system: Secondary | ICD-10-CM | POA: Insufficient documentation

## 2013-01-12 DIAGNOSIS — F411 Generalized anxiety disorder: Secondary | ICD-10-CM | POA: Insufficient documentation

## 2013-01-12 DIAGNOSIS — E119 Type 2 diabetes mellitus without complications: Secondary | ICD-10-CM | POA: Insufficient documentation

## 2013-01-12 DIAGNOSIS — Z8709 Personal history of other diseases of the respiratory system: Secondary | ICD-10-CM | POA: Insufficient documentation

## 2013-01-12 DIAGNOSIS — E785 Hyperlipidemia, unspecified: Secondary | ICD-10-CM | POA: Insufficient documentation

## 2013-01-12 DIAGNOSIS — Z7982 Long term (current) use of aspirin: Secondary | ICD-10-CM | POA: Insufficient documentation

## 2013-01-12 DIAGNOSIS — Z8701 Personal history of pneumonia (recurrent): Secondary | ICD-10-CM | POA: Insufficient documentation

## 2013-01-12 DIAGNOSIS — K219 Gastro-esophageal reflux disease without esophagitis: Secondary | ICD-10-CM | POA: Insufficient documentation

## 2013-01-12 DIAGNOSIS — I10 Essential (primary) hypertension: Secondary | ICD-10-CM | POA: Insufficient documentation

## 2013-01-12 DIAGNOSIS — Z79899 Other long term (current) drug therapy: Secondary | ICD-10-CM | POA: Insufficient documentation

## 2013-01-12 LAB — CBC WITH DIFFERENTIAL/PLATELET
Basophils Relative: 1 % (ref 0–1)
Eosinophils Absolute: 0.1 10*3/uL (ref 0.0–0.7)
Lymphs Abs: 1.2 10*3/uL (ref 0.7–4.0)
MCH: 25.9 pg — ABNORMAL LOW (ref 26.0–34.0)
Neutro Abs: 1.6 10*3/uL — ABNORMAL LOW (ref 1.7–7.7)
Neutrophils Relative %: 50 % (ref 43–77)
Platelets: 239 10*3/uL (ref 150–400)
RBC: 4.68 MIL/uL (ref 3.87–5.11)
WBC: 3.3 10*3/uL — ABNORMAL LOW (ref 4.0–10.5)

## 2013-01-12 LAB — COMPREHENSIVE METABOLIC PANEL
ALT: 7 U/L (ref 0–35)
Albumin: 4.1 g/dL (ref 3.5–5.2)
Alkaline Phosphatase: 82 U/L (ref 39–117)
Chloride: 102 mEq/L (ref 96–112)
Glucose, Bld: 99 mg/dL (ref 70–99)
Potassium: 3.7 mEq/L (ref 3.5–5.1)
Sodium: 138 mEq/L (ref 135–145)
Total Protein: 7.5 g/dL (ref 6.0–8.3)

## 2013-01-12 MED ORDER — WARFARIN SODIUM 5 MG PO TABS: 5.0000 mg | ORAL_TABLET | Freq: Every day | ORAL | Status: DC

## 2013-01-12 MED ORDER — OMEPRAZOLE 20 MG PO CPDR: 20.0000 mg | DELAYED_RELEASE_CAPSULE | Freq: Every day | ORAL | Status: DC

## 2013-01-12 MED ORDER — BENAZEPRIL HCL 10 MG PO TABS: 5.0000 mg | ORAL_TABLET | Freq: Every day | ORAL | Status: DC

## 2013-01-12 MED ORDER — HYDROCODONE-ACETAMINOPHEN 5-325 MG PO TABS
2.0000 | ORAL_TABLET | Freq: Once | ORAL | Status: DC
  Filled 2013-01-12: qty 2

## 2013-01-12 MED ORDER — ACETAMINOPHEN 325 MG PO TABS: 650.0000 mg | ORAL_TABLET | Freq: Once | ORAL | Status: DC

## 2013-01-12 MED ORDER — HYDROCODONE-ACETAMINOPHEN 5-325 MG PO TABS
1.0000 | ORAL_TABLET | Freq: Once | ORAL | Status: AC
  Administered 2013-01-12: 1 via ORAL

## 2013-01-12 MED ORDER — METFORMIN HCL 500 MG PO TABS: 500.0000 mg | ORAL_TABLET | Freq: Two times a day (BID) | ORAL | Status: DC

## 2013-01-12 MED ORDER — ATORVASTATIN CALCIUM 10 MG PO TABS: 10.0000 mg | ORAL_TABLET | Freq: Every day | ORAL | Status: DC

## 2013-01-12 MED ORDER — HYDROCHLOROTHIAZIDE 25 MG PO TABS: 25.0000 mg | ORAL_TABLET | Freq: Every day | ORAL | Status: DC

## 2013-01-12 NOTE — ED Notes (Signed)
Pt reports headache that started Wednesday.  She was seen by an NP during a home visit this am, told BP was 206/110 and instructed to be seen in ED for further evaluation.  Pt has not taken prescribed medications since March for financial reasons.

## 2013-01-12 NOTE — ED Provider Notes (Signed)
CSN: [id number]     Arrival date & time 01/12/13  1202 History   First MD Initiated Contact with Patient 01/12/13 1237     Chief Complaint  Patient presents with  . Headache  . Hypertension   (Consider location/radiation/quality/duration/timing/severity/associated sxs/prior Treatment) Patient is a 67 y.o. female presenting with hypertension. The history is provided by the patient. No language interpreter was used.  Hypertension This is a new problem. Episode onset: 6 months. The problem occurs constantly. The problem has been gradually worsening. Nothing aggravates the symptoms. She has tried nothing for the symptoms. The treatment provided moderate relief.   Pt complains of high blood pressure.  Pt reports she is unable to get any of her mediations.  Pt reports rx cost over 300 dollars.  Pt is trying to get assistance.  Past Medical History  Diagnosis Date  . Hyperlipidemia   . Anxiety   . Pneumonia     "as a baby"  . Bronchitis     hx of  . Diabetes mellitus without complication     on oral medicine  . GERD (gastroesophageal reflux disease)   . H/O hiatal hernia   . Arthritis   . Cataracts, bilateral   . Hypertension     Dr. Johnn Hai    Past Surgical History  Procedure Laterality Date  . Cardiovascular stress test      done approx. 7 years ago  . Esophagogastroduodenoscopy endoscopy    . Abdominal hysterectomy    . Breast surgery    . Tubal ligation    . Total knee arthroplasty  03/31/2012    Procedure: TOTAL KNEE ARTHROPLASTY;  Surgeon: Cammy Copa, MD;  Location: [facility] OR;  Service: Orthopedics;  Laterality: Left;  Left total knee arthroplasty   No family history on file. History  Substance Use Topics  . Smoking status: Never Smoker   . Smokeless tobacco: Not on file  . Alcohol Use: No   OB History   Grav Para Term Preterm Abortions TAB SAB Ect Mult Living                 Review of Systems  Unable to perform ROS All other systems reviewed and are  negative.    Allergies  Review of patient's allergies indicates no known allergies.  Home Medications   Current Outpatient Rx  Name  Route  Sig  Dispense  Refill  . amLODipine-benazepril (LOTREL) 5-40 MG per capsule   Oral   Take 2 capsules by mouth daily.         Marland Kitchen aspirin EC 81 MG tablet   Oral   Take 81 mg by mouth daily.         . Cholecalciferol (VITAMIN D) 2000 UNITS CAPS   Oral   Take 2,000 Units by mouth daily.         Marland Kitchen docusate sodium 100 MG CAPS   Oral   Take 100 mg by mouth 2 (two) times daily.   20 capsule   0   . esomeprazole (NEXIUM) 40 MG capsule   Oral   Take 40 mg by mouth daily before breakfast.         . hydrochlorothiazide (HYDRODIURIL) 25 MG tablet   Oral   Take 25 mg by mouth daily.         Marland Kitchen LORazepam (ATIVAN) 0.5 MG tablet   Oral   Take 0.5 mg by mouth 3 (three) times daily as needed. For anxiety         .  saxagliptin HCl (ONGLYZA) 5 MG TABS tablet   Oral   Take 5 mg by mouth daily.         . simvastatin (ZOCOR) 40 MG tablet   Oral   Take 40 mg by mouth at bedtime.         Marland Kitchen warfarin (COUMADIN) 5 MG tablet   Oral   Take 1 tablet (5 mg total) by mouth daily.   30 tablet   1    BP 167/96  Pulse 86  Temp(Src) 98.7 F (37.1 C) (Oral)  Resp 20  SpO2 100% Physical Exam  Nursing note reviewed. Constitutional: She appears well-developed and well-nourished.  HENT:  Head: Normocephalic.  Right Ear: External ear normal.  Left Ear: External ear normal.  Eyes: Conjunctivae and EOM are normal. Pupils are equal, round, and reactive to light.  Neck: Normal range of motion.  Cardiovascular: Normal rate.   Pulmonary/Chest: Effort normal.  Abdominal: Soft.  Musculoskeletal: Normal range of motion.  Neurological: She is alert.  Skin: Skin is warm.    ED Course  Procedures (including critical care time) Labs Review Labs Reviewed  CBC WITH DIFFERENTIAL - Abnormal; Notable for the following:    WBC 3.3 (*)    MCH  25.9 (*)    Neutro Abs 1.6 (*)    All other components within normal limits  COMPREHENSIVE METABOLIC PANEL   Imaging Review No results found.  MDM   1. Hypertension    I spoke to [facility] Pharmacist who helped me give pt rx for alternative medications at a lower cost.       Elson Areas, PA-C 01/12/13 2303

## 2013-01-13 NOTE — ED Provider Notes (Signed)
Medical screening examination/treatment/procedure(s) were performed by non-physician practitioner and as supervising physician I was immediately available for consultation/collaboration.   Chandra Asher T Yussuf Sawyers, MD 01/13/13 1150 

## 2013-06-26 ENCOUNTER — Encounter: Payer: Self-pay | Admitting: *Deleted

## 2013-12-10 ENCOUNTER — Other Ambulatory Visit: Payer: Self-pay

## 2013-12-10 DIAGNOSIS — Z1231 Encounter for screening mammogram for malignant neoplasm of breast: Secondary | ICD-10-CM

## 2013-12-22 ENCOUNTER — Ambulatory Visit
Admission: RE | Admit: 2013-12-22 | Discharge: 2013-12-22 | Disposition: A | Discharge status: Home / Self Care | Payer: Medicare Other | Source: Ambulatory Visit

## 2013-12-22 ENCOUNTER — Encounter (INDEPENDENT_AMBULATORY_CARE_PROVIDER_SITE_OTHER): Payer: Self-pay

## 2013-12-22 DIAGNOSIS — Z1231 Encounter for screening mammogram for malignant neoplasm of breast: Secondary | ICD-10-CM

## 2014-05-27 ENCOUNTER — Emergency Department (HOSPITAL_COMMUNITY): Payer: Medicare Other

## 2014-05-27 ENCOUNTER — Encounter (HOSPITAL_COMMUNITY): Payer: Self-pay

## 2014-05-27 ENCOUNTER — Inpatient Hospital Stay (HOSPITAL_COMMUNITY)
Admission: EM | Admit: 2014-05-27 | Discharge: 2014-05-30 | DRG: 379 | Disposition: A | Discharge status: Home / Self Care | Payer: Medicare Other | Attending: Internal Medicine | Admitting: Internal Medicine

## 2014-05-27 DIAGNOSIS — R109 Unspecified abdominal pain: Secondary | ICD-10-CM

## 2014-05-27 DIAGNOSIS — K625 Hemorrhage of anus and rectum: Secondary | ICD-10-CM | POA: Diagnosis not present

## 2014-05-27 DIAGNOSIS — K922 Gastrointestinal hemorrhage, unspecified: Secondary | ICD-10-CM | POA: Diagnosis not present

## 2014-05-27 DIAGNOSIS — I10 Essential (primary) hypertension: Secondary | ICD-10-CM | POA: Diagnosis present

## 2014-05-27 DIAGNOSIS — H269 Unspecified cataract: Secondary | ICD-10-CM | POA: Diagnosis present

## 2014-05-27 DIAGNOSIS — Z888 Allergy status to other drugs, medicaments and biological substances status: Secondary | ICD-10-CM

## 2014-05-27 DIAGNOSIS — F419 Anxiety disorder, unspecified: Secondary | ICD-10-CM | POA: Diagnosis present

## 2014-05-27 DIAGNOSIS — E119 Type 2 diabetes mellitus without complications: Secondary | ICD-10-CM | POA: Diagnosis present

## 2014-05-27 DIAGNOSIS — K219 Gastro-esophageal reflux disease without esophagitis: Secondary | ICD-10-CM | POA: Diagnosis present

## 2014-05-27 DIAGNOSIS — Z9889 Other specified postprocedural states: Secondary | ICD-10-CM

## 2014-05-27 DIAGNOSIS — E785 Hyperlipidemia, unspecified: Secondary | ICD-10-CM | POA: Diagnosis present

## 2014-05-27 DIAGNOSIS — K449 Diaphragmatic hernia without obstruction or gangrene: Secondary | ICD-10-CM | POA: Diagnosis present

## 2014-05-27 DIAGNOSIS — K649 Unspecified hemorrhoids: Secondary | ICD-10-CM | POA: Diagnosis present

## 2014-05-27 DIAGNOSIS — I878 Other specified disorders of veins: Secondary | ICD-10-CM | POA: Diagnosis present

## 2014-05-27 DIAGNOSIS — K5790 Diverticulosis of intestine, part unspecified, without perforation or abscess without bleeding: Secondary | ICD-10-CM | POA: Diagnosis present

## 2014-05-27 DIAGNOSIS — Z90710 Acquired absence of both cervix and uterus: Secondary | ICD-10-CM

## 2014-05-27 DIAGNOSIS — M1990 Unspecified osteoarthritis, unspecified site: Secondary | ICD-10-CM | POA: Diagnosis present

## 2014-05-27 DIAGNOSIS — Z7982 Long term (current) use of aspirin: Secondary | ICD-10-CM

## 2014-05-27 DIAGNOSIS — E876 Hypokalemia: Secondary | ICD-10-CM | POA: Diagnosis present

## 2014-05-27 DIAGNOSIS — Z96652 Presence of left artificial knee joint: Secondary | ICD-10-CM | POA: Diagnosis present

## 2014-05-27 HISTORY — DX: Polyp of colon: K63.5

## 2014-05-27 LAB — CBC WITH DIFFERENTIAL/PLATELET
BASOS ABS: 0 10*3/uL (ref 0.0–0.1)
Basophils Relative: 0 % (ref 0–1)
EOS PCT: 2 % (ref 0–5)
Eosinophils Absolute: 0.2 10*3/uL (ref 0.0–0.7)
HCT: 30.8 % — ABNORMAL LOW (ref 36.0–46.0)
HEMOGLOBIN: 10 g/dL — AB (ref 12.0–15.0)
Lymphocytes Relative: 30 % (ref 12–46)
Lymphs Abs: 2 10*3/uL (ref 0.7–4.0)
MCH: 25.6 pg — ABNORMAL LOW (ref 26.0–34.0)
MCHC: 32.5 g/dL (ref 30.0–36.0)
MCV: 78.8 fL (ref 78.0–100.0)
MONOS PCT: 6 % (ref 3–12)
Monocytes Absolute: 0.4 10*3/uL (ref 0.1–1.0)
Neutro Abs: 4 10*3/uL (ref 1.7–7.7)
Neutrophils Relative %: 62 % (ref 43–77)
Platelets: 231 10*3/uL (ref 150–400)
RBC: 3.91 MIL/uL (ref 3.87–5.11)
RDW: 15 % (ref 11.5–15.5)
WBC: 6.5 10*3/uL (ref 4.0–10.5)

## 2014-05-27 LAB — BASIC METABOLIC PANEL
Anion gap: 10 (ref 5–15)
BUN: 12 mg/dL (ref 6–23)
CO2: 24 mmol/L (ref 19–32)
CREATININE: 0.75 mg/dL (ref 0.50–1.10)
Calcium: 9.3 mg/dL (ref 8.4–10.5)
Chloride: 103 mEq/L (ref 96–112)
GFR calc Af Amer: >90 mL/min (ref >90)
GFR calc non Af Amer: 86 mL/min — ABNORMAL LOW (ref >90)
Glucose, Bld: 109 mg/dL — ABNORMAL HIGH (ref 70–99)
Potassium: 3.4 mmol/L — ABNORMAL LOW (ref 3.5–5.1)
SODIUM: 137 mmol/L (ref 135–145)

## 2014-05-27 LAB — HEPATIC FUNCTION PANEL
ALBUMIN: 4.1 g/dL (ref 3.5–5.2)
ALT: 12 U/L (ref 0–35)
AST: 17 U/L (ref 0–37)
Alkaline Phosphatase: 83 U/L (ref 39–117)
BILIRUBIN TOTAL: 0.7 mg/dL (ref 0.3–1.2)
Bilirubin, Direct: 0.1 mg/dL (ref 0.0–0.3)
Indirect Bilirubin: 0.6 mg/dL (ref 0.3–0.9)
Total Protein: 7.2 g/dL (ref 6.0–8.3)

## 2014-05-27 LAB — PROTIME-INR
INR: 1.06 (ref 0.00–1.49)
Prothrombin Time: 13.9 seconds (ref 11.6–15.2)

## 2014-05-27 LAB — ABO/RH: ABO/RH(D): O POS

## 2014-05-27 LAB — I-STAT CG4 LACTIC ACID, ED: Lactic Acid, Venous: 0.81 mmol/L (ref 0.5–2.2)

## 2014-05-27 LAB — APTT: aPTT: 36 seconds (ref 24–37)

## 2014-05-27 MED ORDER — MORPHINE SULFATE 4 MG/ML IJ SOLN
2.0000 mg | Freq: Once | INTRAMUSCULAR | Status: AC
  Administered 2014-05-27: 2 mg via INTRAVENOUS
  Filled 2014-05-27: qty 1

## 2014-05-27 MED ORDER — HYDROMORPHONE HCL 1 MG/ML IJ SOLN: 1.0000 mg | INTRAMUSCULAR | Status: DC | PRN

## 2014-05-27 MED ORDER — SODIUM CHLORIDE 0.9 % IV BOLUS (SEPSIS)
500.0000 mL | Freq: Once | INTRAVENOUS | Status: AC
  Administered 2014-05-27: 500 mL via INTRAVENOUS

## 2014-05-27 MED ORDER — ONDANSETRON HCL 4 MG/2ML IJ SOLN: 4.0000 mg | Freq: Three times a day (TID) | INTRAMUSCULAR | Status: DC | PRN

## 2014-05-27 NOTE — ED Provider Notes (Signed)
CSN: [id number]     Arrival date & time 05/27/14  1740 History   First MD Initiated Contact with Patient 05/27/14 1812     Chief Complaint  Patient presents with  . Rectal Bleeding     (Consider location/radiation/quality/duration/timing/severity/associated sxs/prior Treatment) The history is provided by the patient and medical records. No language interpreter was used.     Donna Wu is a 69 y.o. female  with a hx of HLD, anxiety, bronchitis, NIDDM, arthritis, HTN presents to the Emergency Department complaining of intermittent dark bloody stools beginning yesterday, 2 days after a colonoscopy and polypectomy performed 3 days ago.  Pt reports waxing and waning lower and mid abd cramping worst just before a BM.  She reports for episodes of large, grossly bloody bowel movements with maroon colored blood.   She reports history of hemorrhoids and persistent rectal pain worse since this time.  Pt reports hx of coumadin usage but this was DVT prophylaxis after knee replacement and this medication was stopped approximately one year ago.  She takes 1 ASA per day.  Pt reports she has associated lower back pain since the colonoscopy, but this is unchanged from her baseline.  Pt reports last polypectomy in 4008 without complications, abd pain or rectal bleeding. Nothing makes her symptoms better or worse. She denies fever, chills, headache, neck pain, chest pain, shortness of breath, nausea, vomiting, weakness, dizziness, syncope.   Colonoscopy performed by: Paulita Fujita  Past Medical History  Diagnosis Date  . Hyperlipidemia   . Anxiety   . Pneumonia     "as a baby"  . Bronchitis     hx of  . Diabetes mellitus without complication     on oral medicine  . GERD (gastroesophageal reflux disease)   . H/O hiatal hernia   . Arthritis   . Cataracts, bilateral   . Hypertension     Dr. [name]   . Colon polyps    Past Surgical History  Procedure Laterality Date  . Cardiovascular stress test       done approx. 7 years ago  . Esophagogastroduodenoscopy endoscopy    . Abdominal hysterectomy    . Breast surgery    . Tubal ligation    . Total knee arthroplasty  03/31/2012    Procedure: TOTAL KNEE ARTHROPLASTY;  Surgeon: Meredith Pel, MD;  Location: [place];  Service: Orthopedics;  Laterality: Left;  Left total knee arthroplasty   Family History  Problem Relation Age of Onset  . Dementia Father   . Hypertension Mother   . Heart attack Mother 27  . Hypertension Brother   . Hypertension Brother   . Lung cancer Brother   . Hypertension Sister   . Hypertension Sister    History  Substance Use Topics  . Smoking status: Never Smoker   . Smokeless tobacco: Not on file  . Alcohol Use: No   OB History    No data available     Review of Systems  Constitutional: Negative for fever, diaphoresis, appetite change, fatigue and unexpected weight change.  HENT: Negative for mouth sores and trouble swallowing.   Respiratory: Negative for cough, chest tightness, shortness of breath, wheezing and stridor.   Cardiovascular: Negative for chest pain and palpitations.  Gastrointestinal: Positive for abdominal pain and blood in stool. Negative for nausea, vomiting, constipation, abdominal distention and rectal pain.  Genitourinary: Negative for dysuria, urgency, frequency, hematuria, flank pain and difficulty urinating.  Musculoskeletal: Negative for back pain, neck pain and neck stiffness.  Skin: Negative for rash.  Neurological: Negative for weakness.  Hematological: Negative for adenopathy.  Psychiatric/Behavioral: Negative for confusion.  All other systems reviewed and are negative.     Allergies  Review of patient's allergies indicates no known allergies.  Home Medications   Prior to Admission medications   Medication Sig Start Date End Date Taking? Authorizing Provider  amLODipine-benazepril (LOTREL) 5-40 MG per capsule Take 2 capsules by mouth daily.    Historical  Provider, MD  aspirin EC 81 MG tablet Take 81 mg by mouth daily.    Historical Provider, MD  atorvastatin (LIPITOR) 10 MG tablet Take 1 tablet (10 mg total) by mouth daily. 01/12/13   Fransico Meadow, PA-C  benazepril (LOTENSIN) 10 MG tablet Take 0.5 tablets (5 mg total) by mouth daily. 01/12/13   Fransico Meadow, PA-C  Cholecalciferol (VITAMIN D) 2000 UNITS CAPS Take 2,000 Units by mouth daily.    Historical Provider, MD  docusate sodium 100 MG CAPS Take 100 mg by mouth 2 (two) times daily. 04/03/12   Meredith Pel, MD  esomeprazole (NEXIUM) 40 MG capsule Take 40 mg by mouth daily before breakfast.    Historical Provider, MD  hydrochlorothiazide (HYDRODIURIL) 25 MG tablet Take 1 tablet (25 mg total) by mouth daily. 01/12/13   Fransico Meadow, PA-C  LORazepam (ATIVAN) 0.5 MG tablet Take 0.5 mg by mouth 3 (three) times daily as needed. For anxiety    Historical Provider, MD  metFORMIN (GLUCOPHAGE) 500 MG tablet Take 1 tablet (500 mg total) by mouth 2 (two) times daily with a meal. 01/12/13   Fransico Meadow, PA-C  omeprazole (PRILOSEC) 20 MG capsule Take 1 capsule (20 mg total) by mouth daily. 01/12/13   Fransico Meadow, PA-C  saxagliptin HCl (ONGLYZA) 5 MG TABS tablet Take 5 mg by mouth daily.    Historical Provider, MD  simvastatin (ZOCOR) 40 MG tablet Take 40 mg by mouth at bedtime.    Historical Provider, MD  warfarin (COUMADIN) 5 MG tablet Take 1 tablet (5 mg total) by mouth daily. 01/12/13   Fransico Meadow, PA-C   BP 143/68 mmHg  Pulse 85  Temp(Src) 98.3 F (36.8 C) (Oral)  Resp 18  SpO2 100% Physical Exam  Constitutional: She appears well-developed and well-nourished.  HENT:  Head: Normocephalic and atraumatic.  Mouth/Throat: Oropharynx is clear and moist.  Eyes: Conjunctivae are normal. No scleral icterus.  Cardiovascular: Normal rate, regular rhythm, normal heart sounds and intact distal pulses.   No murmur heard. Pulmonary/Chest: Effort normal and breath sounds normal.  Abdominal:  Soft. Bowel sounds are normal. She exhibits no distension and no mass. There is tenderness in the right lower quadrant, suprapubic area and left lower quadrant. There is no rebound, no guarding and no CVA tenderness.  Genitourinary: Rectal exam shows external hemorrhoid. Rectal exam shows no internal hemorrhoid, no fissure, no mass, no tenderness and anal tone normal. Guaiac positive stool.  Musculoskeletal: Normal range of motion. She exhibits no edema or tenderness.  Neurological: She is alert. She exhibits normal muscle tone. Coordination normal.  Skin: Skin is warm and dry. No erythema.  Psychiatric: She has a normal mood and affect.  Nursing note and vitals reviewed.   ED Course  Procedures (including critical care time) Labs Review Labs Reviewed  CBC WITH DIFFERENTIAL - Abnormal; Notable for the following:    Hemoglobin 10.0 (*)    HCT 30.8 (*)    MCH 25.6 (*)    All other components  within normal limits  BASIC METABOLIC PANEL - Abnormal; Notable for the following:    Potassium 3.4 (*)    Glucose, Bld 109 (*)    GFR calc non Af Amer 86 (*)    All other components within normal limits  PROTIME-INR  HEPATIC FUNCTION PANEL  APTT  I-STAT CG4 LACTIC ACID, ED  POC OCCULT BLOOD, ED  TYPE AND SCREEN  ABO/RH    Imaging Review Dg Abd Acute W/chest  05/27/2014   CLINICAL DATA:  Rectal bleeding for 1 day. Recent colonic polyp removal. Abdominal cramping  EXAM: ACUTE ABDOMEN SERIES (ABDOMEN 2 VIEW & CHEST 1 VIEW)  COMPARISON:  Chest radiograph March 24, 2012  FINDINGS: PA chest: There is no edema or consolidation. Heart size and pulmonary vascularity are normal. No adenopathy. There is degenerative change in the thoracic spine with mid thoracic levoscoliosis.  Supine and upright abdomen: There is fairly diffuse stool throughout colon. There is no bowel dilatation. There are, however, scattered air-fluid levels on the right. No free air. There are phleboliths in the pelvis.  IMPRESSION:  Scattered air-fluid levels. Suspect enteritis or early ileus. A degree of obstruction is possible but felt to be less likely. No free air. No lung edema or consolidation. Fairly diffuse stool throughout colon.   Electronically Signed   By: Lowella Grip M.D.   On: 05/27/2014 19:31     EKG Interpretation None      MDM   Final diagnoses:  Abdominal pain  S/P colonoscopic polypectomy  Lower GI bleed   Donna Wu presents 3 days post colonoscopy and polypectomy.  Clinically with GI bleed.  Will obtain labs and acute abd series to r/o microperforation.  Patient declines pain control. DRE with gross maroon colored blood. External hemorrhoid noted without bleeding or fissure.  8:25PM Pt   9:05 PM Acute abdomen with scattered air-fluid levels suspicious for an enteritis or early ileus no free air.  Labs reassuring and patient with mild anemia 10.0. White blood cell count 6.5. Will discuss with gastroenterology and plan for admission.  The patient was discussed with and seen by Dr. Lacinda Axon who agrees with the treatment plan.  9:20 PM Pt discussed with Dr. Watt Climes who requests clear liquid diet, repeat CBC in the morning and clear liquids overnight with monitoring. Patient's tachycardia has resolved with fluid bolus and she's been given pain medicine with adequate relief. She is stable at this time.   9:26 PM Discussed with Dr. Hal Hope who will admit to Telemetry.     BP 143/68 mmHg  Pulse 85  Temp(Src) 98.3 F (36.8 C) (Oral)  Resp 18  SpO2 100%      Abigail Butts, PA-C 05/27/14 2126  Nat Christen, MD 05/28/14 1048

## 2014-05-27 NOTE — ED Notes (Signed)
PA at bedside.

## 2014-05-27 NOTE — ED Notes (Signed)
Pt had colonoscopy with polyp removal on Tuesday.  Has had 4 episodes of rectal bleeding. Red. Since yesterday.  No n/v.  Slight abdominal cramp.

## 2014-05-28 ENCOUNTER — Encounter (HOSPITAL_COMMUNITY): Payer: Self-pay | Admitting: Internal Medicine

## 2014-05-28 DIAGNOSIS — E876 Hypokalemia: Secondary | ICD-10-CM | POA: Diagnosis present

## 2014-05-28 DIAGNOSIS — K5790 Diverticulosis of intestine, part unspecified, without perforation or abscess without bleeding: Secondary | ICD-10-CM | POA: Diagnosis present

## 2014-05-28 DIAGNOSIS — Z96652 Presence of left artificial knee joint: Secondary | ICD-10-CM | POA: Diagnosis present

## 2014-05-28 DIAGNOSIS — K922 Gastrointestinal hemorrhage, unspecified: Principal | ICD-10-CM

## 2014-05-28 DIAGNOSIS — K219 Gastro-esophageal reflux disease without esophagitis: Secondary | ICD-10-CM | POA: Diagnosis present

## 2014-05-28 DIAGNOSIS — E119 Type 2 diabetes mellitus without complications: Secondary | ICD-10-CM

## 2014-05-28 DIAGNOSIS — M1990 Unspecified osteoarthritis, unspecified site: Secondary | ICD-10-CM | POA: Diagnosis present

## 2014-05-28 DIAGNOSIS — K449 Diaphragmatic hernia without obstruction or gangrene: Secondary | ICD-10-CM | POA: Diagnosis present

## 2014-05-28 DIAGNOSIS — Z888 Allergy status to other drugs, medicaments and biological substances status: Secondary | ICD-10-CM | POA: Diagnosis not present

## 2014-05-28 DIAGNOSIS — Z7982 Long term (current) use of aspirin: Secondary | ICD-10-CM | POA: Diagnosis not present

## 2014-05-28 DIAGNOSIS — Z9889 Other specified postprocedural states: Secondary | ICD-10-CM

## 2014-05-28 DIAGNOSIS — Z90710 Acquired absence of both cervix and uterus: Secondary | ICD-10-CM | POA: Diagnosis not present

## 2014-05-28 DIAGNOSIS — I10 Essential (primary) hypertension: Secondary | ICD-10-CM | POA: Diagnosis present

## 2014-05-28 DIAGNOSIS — E785 Hyperlipidemia, unspecified: Secondary | ICD-10-CM | POA: Diagnosis present

## 2014-05-28 DIAGNOSIS — I878 Other specified disorders of veins: Secondary | ICD-10-CM | POA: Diagnosis present

## 2014-05-28 DIAGNOSIS — H269 Unspecified cataract: Secondary | ICD-10-CM | POA: Diagnosis present

## 2014-05-28 DIAGNOSIS — K649 Unspecified hemorrhoids: Secondary | ICD-10-CM | POA: Diagnosis present

## 2014-05-28 DIAGNOSIS — K625 Hemorrhage of anus and rectum: Secondary | ICD-10-CM | POA: Diagnosis present

## 2014-05-28 DIAGNOSIS — F419 Anxiety disorder, unspecified: Secondary | ICD-10-CM | POA: Diagnosis present

## 2014-05-28 LAB — CBC
HCT: 25.7 % — ABNORMAL LOW (ref 36.0–46.0)
HCT: 25.2 % — ABNORMAL LOW (ref 36.0–46.0)
HEMATOCRIT: 26.9 % — AB (ref 36.0–46.0)
Hemoglobin: 8.3 g/dL — ABNORMAL LOW (ref 12.0–15.0)
Hemoglobin: 8.2 g/dL — ABNORMAL LOW (ref 12.0–15.0)
Hemoglobin: 8.8 g/dL — ABNORMAL LOW (ref 12.0–15.0)
MCH: 25.7 pg — AB (ref 26.0–34.0)
MCH: 25.9 pg — ABNORMAL LOW (ref 26.0–34.0)
MCH: 26 pg (ref 26.0–34.0)
MCHC: 32.3 g/dL (ref 30.0–36.0)
MCHC: 32.5 g/dL (ref 30.0–36.0)
MCHC: 32.7 g/dL (ref 30.0–36.0)
MCV: 79.6 fL (ref 78.0–100.0)
MCV: 79.5 fL (ref 78.0–100.0)
MCV: 79.4 fL (ref 78.0–100.0)
PLATELETS: 204 10*3/uL (ref 150–400)
Platelets: 195 10*3/uL (ref 150–400)
Platelets: 196 10*3/uL (ref 150–400)
RBC: 3.23 MIL/uL — ABNORMAL LOW (ref 3.87–5.11)
RBC: 3.17 MIL/uL — AB (ref 3.87–5.11)
RBC: 3.39 MIL/uL — ABNORMAL LOW (ref 3.87–5.11)
RDW: 14.9 % (ref 11.5–15.5)
RDW: 14.9 % (ref 11.5–15.5)
RDW: 15 % (ref 11.5–15.5)
WBC: 6 10*3/uL (ref 4.0–10.5)
WBC: 5 10*3/uL (ref 4.0–10.5)
WBC: 5.1 10*3/uL (ref 4.0–10.5)

## 2014-05-28 LAB — COMPREHENSIVE METABOLIC PANEL
ALBUMIN: 3.4 g/dL — AB (ref 3.5–5.2)
ALT: 9 U/L (ref 0–35)
AST: 11 U/L (ref 0–37)
Alkaline Phosphatase: 67 U/L (ref 39–117)
Anion gap: 7 (ref 5–15)
BUN: 13 mg/dL (ref 6–23)
CALCIUM: 8.6 mg/dL (ref 8.4–10.5)
CO2: 25 mmol/L (ref 19–32)
CREATININE: 0.65 mg/dL (ref 0.50–1.10)
Chloride: 105 mEq/L (ref 96–112)
GFR, EST NON AFRICAN AMERICAN: 90 mL/min — AB (ref >90)
Glucose, Bld: 104 mg/dL — ABNORMAL HIGH (ref 70–99)
POTASSIUM: 3.4 mmol/L — AB (ref 3.5–5.1)
Sodium: 137 mmol/L (ref 135–145)
Total Bilirubin: 0.5 mg/dL (ref 0.3–1.2)
Total Protein: 6.1 g/dL (ref 6.0–8.3)

## 2014-05-28 LAB — GLUCOSE, CAPILLARY
GLUCOSE-CAPILLARY: 95 mg/dL (ref 70–99)
GLUCOSE-CAPILLARY: 115 mg/dL — AB (ref 70–99)
Glucose-Capillary: 93 mg/dL (ref 70–99)
Glucose-Capillary: 86 mg/dL (ref 70–99)

## 2014-05-28 LAB — PREPARE RBC (CROSSMATCH)

## 2014-05-28 MED ORDER — ONDANSETRON HCL 4 MG/2ML IJ SOLN: 4.0000 mg | Freq: Four times a day (QID) | INTRAMUSCULAR | Status: DC | PRN

## 2014-05-28 MED ORDER — ONDANSETRON HCL 4 MG PO TABS: 4.0000 mg | ORAL_TABLET | Freq: Four times a day (QID) | ORAL | Status: DC | PRN

## 2014-05-28 MED ORDER — MORPHINE SULFATE 2 MG/ML IJ SOLN: 1.0000 mg | INTRAMUSCULAR | Status: DC | PRN

## 2014-05-28 MED ORDER — PANTOPRAZOLE SODIUM 40 MG IV SOLR
40.0000 mg | Freq: Every day | INTRAVENOUS | Status: DC
  Administered 2014-05-28 – 2014-05-29 (×3): 40 mg via INTRAVENOUS
  Filled 2014-05-28 (×4): qty 40

## 2014-05-28 MED ORDER — LORAZEPAM 0.5 MG PO TABS: 0.5000 mg | ORAL_TABLET | Freq: Three times a day (TID) | ORAL | Status: DC | PRN

## 2014-05-28 MED ORDER — SODIUM CHLORIDE 0.9 % IV SOLN
INTRAVENOUS | Status: AC
  Administered 2014-05-28 (×2): via INTRAVENOUS

## 2014-05-28 MED ORDER — ACETAMINOPHEN 325 MG PO TABS
650.0000 mg | ORAL_TABLET | Freq: Four times a day (QID) | ORAL | Status: DC | PRN
  Administered 2014-05-28 – 2014-05-29 (×2): 650 mg via ORAL
  Filled 2014-05-28 (×3): qty 2

## 2014-05-28 MED ORDER — METOPROLOL SUCCINATE ER 25 MG PO TB24
25.0000 mg | ORAL_TABLET | Freq: Every day | ORAL | Status: DC
  Administered 2014-05-28 – 2014-05-30 (×3): 25 mg via ORAL
  Filled 2014-05-28 (×3): qty 1

## 2014-05-28 MED ORDER — SERTRALINE HCL 50 MG PO TABS
50.0000 mg | ORAL_TABLET | Freq: Every day | ORAL | Status: DC
  Administered 2014-05-28 – 2014-05-30 (×3): 50 mg via ORAL
  Filled 2014-05-28 (×3): qty 1

## 2014-05-28 MED ORDER — ACETAMINOPHEN 650 MG RE SUPP: 650.0000 mg | Freq: Four times a day (QID) | RECTAL | Status: DC | PRN

## 2014-05-28 MED ORDER — INSULIN ASPART 100 UNIT/ML ~~LOC~~ SOLN
0.0000 [IU] | Freq: Three times a day (TID) | SUBCUTANEOUS | Status: DC
  Administered 2014-05-30: 1 [IU] via SUBCUTANEOUS

## 2014-05-28 MED ORDER — ATORVASTATIN CALCIUM 20 MG PO TABS
20.0000 mg | ORAL_TABLET | Freq: Every day | ORAL | Status: DC
  Administered 2014-05-28 – 2014-05-29 (×2): 20 mg via ORAL
  Filled 2014-05-28 (×3): qty 1

## 2014-05-28 NOTE — Progress Notes (Signed)
2:58 PM I agree with HPI/GPe and A/P per Dr. Hal Hope  69 y/o ? DMty 2, HLd,Htn recent Knee Arthoplasty 11/15 admit 05/28/14 with GI bleed Had recent colonoscopy c Dr. Paulita Fujita recenlty~ 2 days prior to admit devloped BRB per rectum which seems to be slwoing        Patient Active Problem List   Diagnosis Date Noted  . Essential hypertension 05/28/2014  . Hyperlipidemia 05/28/2014  . Diabetes mellitus type 2, controlled 05/28/2014  . S/P colonoscopic polypectomy   . Lower GI bleed 05/27/2014   Agree with HPI and Dr. Perley Jain inout Monitor on diet and if no bleed, ? D/c am for OV f/u  Verneita Griffes, MD [facility] [phone]

## 2014-05-28 NOTE — Progress Notes (Signed)
Donna Wu 12:01 PM  Subjective: Patient without any further bleeding since 8 PM but still has some increased gas and we discussed her colonoscopy and she has not restarted her aspirin which she takes just prophylactically without any previous heart or CNS problem and she has no new complaints  Objective: Vital signs stable afebrile no acute distress abdomen is soft nontender good bowel sounds hemoglobin stable BUN okay  Assessment: Probable post polypectomy bleeding  Plan: If no bleeding today may have soft solids this evening for dinner and if stable can go home tomorrow however if signs of bleeding would keep on clear liquids and consider nuclear bleeding scan versus semiurgent colonoscopy and hold aspirin for 2 weeks at home and no nonsteroidals and if she does go home tomorrow follow-up next week in the office with Dr. Paulita Fujita to recheck hemoglobin and make sure no further workup or plans are needed and patient agrees with the plan [facility]

## 2014-05-28 NOTE — H&P (Signed)
[facility] History and Physical  Donna Wu RKY:[id number] DOB: [birth year] DOA: 05/27/2014  Referring physician: ER physician. PCP: Lilian Coma, MD   Chief Complaint: Rectal bleeding.  HPI: Donna Wu is a 69 y.o. female with history of diabetes mellitus, hyperlipidemia and hypertension who has had a colonoscopy 2 days ago presents to the ER because of rectal bleeding. Patient has had 3-4 episodes of intermittent bleeding episodes. Denies any abdominal pain nausea vomiting. Patient's colonoscopy showed mild diverticulosis and also had 6 polypectomies. Gastroenterology Dr. Watt Climes was notified by the ER physician and they will be seeing patient in consult patient has been admitted for further management. Patient is hemodynamically stable. He denies taking any NSAIDs. Medication list shows that patient is on aspirin. Patient denies any chest pain or shortness of breath.  Review of Systems: As presented in the history of presenting illness, rest negative.  Past Medical History  Diagnosis Date  . Hyperlipidemia   . Anxiety   . Pneumonia     "as a baby"  . Bronchitis     hx of  . Diabetes mellitus without complication     on oral medicine  . GERD (gastroesophageal reflux disease)   . H/O hiatal hernia   . Arthritis   . Cataracts, bilateral   . Hypertension     Dr. [name]   . Colon polyps    Past Surgical History  Procedure Laterality Date  . Cardiovascular stress test      done approx. 7 years ago  . Esophagogastroduodenoscopy endoscopy    . Abdominal hysterectomy    . Breast surgery    . Tubal ligation    . Total knee arthroplasty  03/31/2012    Procedure: TOTAL KNEE ARTHROPLASTY;  Surgeon: Meredith Pel, MD;  Location: [place];  Service: Orthopedics;  Laterality: Left;  Left total knee arthroplasty   Social History:  reports that she has never smoked. She does not have any smokeless tobacco history on file. She reports that she does not drink  alcohol or use illicit drugs. Where does patient live at home. Can patient participate in ADLs? Yes.  Allergies  Allergen Reactions  . Metformin And Related Diarrhea    Family History:  Family History  Problem Relation Age of Onset  . Dementia Father   . Hypertension Mother   . Heart attack Mother 33  . Hypertension Brother   . Hypertension Brother   . Lung cancer Brother   . Hypertension Sister   . Hypertension Sister       Prior to Admission medications   Medication Sig Start Date End Date Taking? Authorizing Provider  aspirin EC 81 MG tablet Take 81 mg by mouth daily.   Yes Historical Provider, MD  atorvastatin (LIPITOR) 10 MG tablet Take 1 tablet (10 mg total) by mouth daily. 01/12/13  Yes Hollace Kinnier Sofia, PA-C  atorvastatin (LIPITOR) 20 MG tablet Take 20 mg by mouth daily.   Yes Historical Provider, MD  benazepril (LOTENSIN) 10 MG tablet Take 0.5 tablets (5 mg total) by mouth daily. Patient taking differently: Take 10 mg by mouth daily.  01/12/13  Yes Hollace Kinnier Sofia, PA-C  cetirizine (ZYRTEC) 10 MG tablet Take 10 mg by mouth daily.   Yes Historical Provider, MD  Cholecalciferol (VITAMIN D3) 5000 UNITS CAPS Take 1 capsule by mouth daily.   Yes Historical Provider, MD  hydrochlorothiazide (HYDRODIURIL) 25 MG tablet Take 1 tablet (25 mg total) by mouth daily. 01/12/13  Yes Fransico Meadow, PA-C  LORazepam (ATIVAN) 0.5 MG tablet Take 0.5 mg by mouth 3 (three) times daily as needed. For anxiety   Yes Historical Provider, MD  metoprolol succinate (TOPROL-XL) 25 MG 24 hr tablet Take 25 mg by mouth daily.   Yes Historical Provider, MD  omeprazole (PRILOSEC) 20 MG capsule Take 1 capsule (20 mg total) by mouth daily. 01/12/13  Yes Hollace Kinnier Sofia, PA-C  omeprazole (PRILOSEC) 40 MG capsule Take 40 mg by mouth daily.   Yes Historical Provider, MD  sertraline (ZOLOFT) 50 MG tablet Take 50 mg by mouth daily.   Yes Historical Provider, MD  docusate sodium 100 MG CAPS Take 100 mg by mouth 2 (two)  times daily. 04/03/12   Meredith Pel, MD  metFORMIN (GLUCOPHAGE) 500 MG tablet Take 1 tablet (500 mg total) by mouth 2 (two) times daily with a meal. 01/12/13   Fransico Meadow, PA-C  warfarin (COUMADIN) 5 MG tablet Take 1 tablet (5 mg total) by mouth daily. 01/12/13   Fransico Meadow, PA-C    Physical Exam: Filed Vitals:   05/27/14 1752 05/27/14 2055 05/27/14 2230  BP: 161/82 143/68 129/63  Pulse: 106 85 75  Temp: 98.3 F (36.8 C)  98.2 F (36.8 C)  TempSrc: Oral  Oral  Resp: 18 18 16   Height:   5\' 3"  (1.6 m)  Weight:   80.831 kg (178 lb 3.2 oz)  SpO2: 100% 100% 100%     General:  Moderately built and nourished.  Eyes: Anicteric no pallor.  ENT: No discharge from the ears eyes nose or mouth.  Neck: No mass felt.  Cardiovascular: S1-S2 heard.  Respiratory: No rhonchi or crepitations.  Abdomen: Soft nontender bowel sounds present.  Skin: No rash.  Musculoskeletal: No edema.  Psychiatric: Appears normal.  Neurologic: Alert awake oriented to time place and person. Moves all extremities.  Labs on Admission:  Basic Metabolic Panel:  Recent Labs Lab 05/27/14 1820  NA 137  K 3.4*  CL 103  CO2 24  GLUCOSE 109*  BUN 12  CREATININE 0.75  CALCIUM 9.3   Liver Function Tests:  Recent Labs Lab 05/27/14 1820  AST 17  ALT 12  ALKPHOS 83  BILITOT 0.7  PROT 7.2  ALBUMIN 4.1   No results for input(s): LIPASE, AMYLASE in the last 168 hours. No results for input(s): AMMONIA in the last 168 hours. CBC:  Recent Labs Lab 05/27/14 1820  WBC 6.5  NEUTROABS 4.0  HGB 10.0*  HCT 30.8*  MCV 78.8  PLT 231   Cardiac Enzymes: No results for input(s): CKTOTAL, CKMB, CKMBINDEX, TROPONINI in the last 168 hours.  BNP (last 3 results) No results for input(s): PROBNP in the last 8760 hours. CBG: No results for input(s): GLUCAP in the last 168 hours.  Radiological Exams on Admission: Dg Abd Acute W/chest  05/27/2014   CLINICAL DATA:  Rectal bleeding for 1 day.  Recent colonic polyp removal. Abdominal cramping  EXAM: ACUTE ABDOMEN SERIES (ABDOMEN 2 VIEW & CHEST 1 VIEW)  COMPARISON:  Chest radiograph March 24, 2012  FINDINGS: PA chest: There is no edema or consolidation. Heart size and pulmonary vascularity are normal. No adenopathy. There is degenerative change in the thoracic spine with mid thoracic levoscoliosis.  Supine and upright abdomen: There is fairly diffuse stool throughout colon. There is no bowel dilatation. There are, however, scattered air-fluid levels on the right. No free air. There are phleboliths in the pelvis.  IMPRESSION: Scattered air-fluid levels. Suspect enteritis or early ileus.  A degree of obstruction is possible but felt to be less likely. No free air. No lung edema or consolidation. Fairly diffuse stool throughout colon.   Electronically Signed   By: Lowella Grip M.D.   On: 05/27/2014 19:31     Assessment/Plan Principal Problem:   Lower GI bleed Active Problems:   Essential hypertension   Hyperlipidemia   Diabetes mellitus type 2, controlled   S/P colonoscopic polypectomy   1. Rectal bleeding - with recent colonoscopy and polypectomy may be bleeding from the polypectomy sites and coloscopy also did show mild diverticulosis which could also be the source. Discontinue aspirin for now. Closely follow CBC. Type and screen. Clear liquid diet for now. As per oncologist was notified by ER physician. 2. Hypertension - hold antihypertensives for now and patient has been placed on when necessary IV hydralazine for systolic blood pressure more than 160. 3. Diabetes mellitus type 2 presently diet controlled - closely follow CBGs with sliding scale coverage. 4. Hyperlipidemia continue present medications.   DVT Prophylaxis SCDs  Code Status: Full code  Family Communication: Patient's daughter at the bedside.  Disposition Plan: Admit to inpatient.    Draycen Leichter N. [facility] Pager [phone].  If 7PM-7AM, please  contact night-coverage [email or web address] Password [facility] 05/28/2014, 12:23 AM

## 2014-05-29 DIAGNOSIS — E785 Hyperlipidemia, unspecified: Secondary | ICD-10-CM

## 2014-05-29 LAB — GLUCOSE, CAPILLARY
GLUCOSE-CAPILLARY: 100 mg/dL — AB (ref 70–99)
GLUCOSE-CAPILLARY: 91 mg/dL (ref 70–99)
GLUCOSE-CAPILLARY: 107 mg/dL — AB (ref 70–99)
Glucose-Capillary: 110 mg/dL — ABNORMAL HIGH (ref 70–99)
Glucose-Capillary: 130 mg/dL — ABNORMAL HIGH (ref 70–99)

## 2014-05-29 MED ORDER — POTASSIUM CHLORIDE CRYS ER 20 MEQ PO TBCR
40.0000 meq | EXTENDED_RELEASE_TABLET | Freq: Once | ORAL | Status: AC
  Administered 2014-05-29: 40 meq via ORAL
  Filled 2014-05-29: qty 2

## 2014-05-29 NOTE — Progress Notes (Signed)
Donna Wu 11:08 AM  Subjective: Patient doing well with only 1 minimal bowel movement yesterday and one small one today and just dark and maybe a few clots but no fresh blood and she's feeling better overall and tolerating solid food  Objective: Vital signs stable afebrile no acute distress abdomen is soft nontender hemoglobin actually increased a little  Assessment: Probable post polypectomy bleeding currently stable  Plan: Okay with me to go home midafternoon if no further bleeding and the warnings at home were discussed and she should follow-up with my partner Dr. Paulita Fujita either the end of this week or early next week and no aspirin or nonsteroidals was discussed and Tylenol only and we also discussed the low-residue diet and taking a multivitamin plus iron and warnings of anemia were discussed and please call if I can be of any further assistance today  [facility] E

## 2014-05-29 NOTE — Progress Notes (Signed)
[facility] PROGRESS NOTE   Donna Wu YFR:[id number] DOB: 07-Jun-1945 DOA: 05/27/2014 PCP: Lilian Coma, MD  HPI/Subjective: Had 2 bloody bowel movements since yesterday, one this morning.  Assessment/Plan: Principal Problem:   Lower GI bleed Active Problems:   Essential hypertension   Hyperlipidemia   Diabetes mellitus type 2, controlled   S/P colonoscopic polypectomy    Lower GI bleed Patient presented to the hospital with bright red blood per rectum. Had colonoscopy on Thursday 1/7 with 5 polyps removal, start bleeding after that. Patient seen by GI, recommended monitoring, and even can be discharged later today if no further bleeding.  Essential hypertension Stable, no changes in medications.  Hyperlipidemia Continue home medications.  Diabetes mellitus type 2 Controlled, on SSI.  Hypokalemia Replace with oral supplements.   Code Status: Full code Family Communication: Plan discussed with the patient. Disposition Plan: Remains inpatient   Consultants:  [facility]  Procedures:  None  Antibiotics:  None   Objective: Filed Vitals:   05/29/14 1036  BP: 134/61  Pulse: 75  Temp:   Resp:     Intake/Output Summary (Last 24 hours) at 05/29/14 1217 Last data filed at 05/29/14 0858  Gross per 24 hour  Intake 1677.5 ml  Output      0 ml  Net 1677.5 ml   Filed Weights   05/27/14 2230  Weight: 80.831 kg (178 lb 3.2 oz)    Exam: General: Alert and awake, oriented x3, not in any acute distress. HEENT: anicteric sclera, pupils reactive to light and accommodation, EOMI CVS: S1-S2 clear, no murmur rubs or gallops Chest: clear to auscultation bilaterally, no wheezing, rales or rhonchi Abdomen: soft nontender, nondistended, normal bowel sounds, no organomegaly Extremities: no cyanosis, clubbing or edema noted bilaterally Neuro: Cranial nerves II-XII intact, no focal neurological deficits  Data Reviewed: Basic Metabolic  Panel:  Recent Labs Lab 05/27/14 1820 05/28/14 0440  NA 137 137  K 3.4* 3.4*  CL 103 105  CO2 24 25  GLUCOSE 109* 104*  BUN 12 13  CREATININE 0.75 0.65  CALCIUM 9.3 8.6   Liver Function Tests:  Recent Labs Lab 05/27/14 1820 05/28/14 0440  AST 17 11  ALT 12 9  ALKPHOS 83 67  BILITOT 0.7 0.5  PROT 7.2 6.1  ALBUMIN 4.1 3.4*   No results for input(s): LIPASE, AMYLASE in the last 168 hours. No results for input(s): AMMONIA in the last 168 hours. CBC:  Recent Labs Lab 05/27/14 1820 05/28/14 0105 05/28/14 0417 05/28/14 0818  WBC 6.5 6.0 5.0 5.1  NEUTROABS 4.0  --   --   --   HGB 10.0* 8.3* 8.2* 8.8*  HCT 30.8* 25.7* 25.2* 26.9*  MCV 78.8 79.6 79.5 79.4  PLT 231 204 195 196   Cardiac Enzymes: No results for input(s): CKTOTAL, CKMB, CKMBINDEX, TROPONINI in the last 168 hours. BNP (last 3 results) No results for input(s): PROBNP in the last 8760 hours. CBG:  Recent Labs Lab 05/28/14 0746 05/28/14 1322 05/28/14 1725 05/28/14 2138 05/29/14 0808  GLUCAP 93 95 86 115* 110*    Micro No results found for this or any previous visit (from the past 240 hour(s)).   Studies: Dg Abd Acute W/chest  05/27/2014   CLINICAL DATA:  Rectal bleeding for 1 day. Recent colonic polyp removal. Abdominal cramping  EXAM: ACUTE ABDOMEN SERIES (ABDOMEN 2 VIEW & CHEST 1 VIEW)  COMPARISON:  Chest radiograph March 24, 2012  FINDINGS: PA chest: There is no edema or consolidation. Heart size and  pulmonary vascularity are normal. No adenopathy. There is degenerative change in the thoracic spine with mid thoracic levoscoliosis.  Supine and upright abdomen: There is fairly diffuse stool throughout colon. There is no bowel dilatation. There are, however, scattered air-fluid levels on the right. No free air. There are phleboliths in the pelvis.  IMPRESSION: Scattered air-fluid levels. Suspect enteritis or early ileus. A degree of obstruction is possible but felt to be less likely. No free air. No  lung edema or consolidation. Fairly diffuse stool throughout colon.   Electronically Signed   By: Lowella Grip M.D.   On: 05/27/2014 19:31    Scheduled Meds: . atorvastatin  20 mg Oral q1800  . insulin aspart  0-9 Units Subcutaneous TID WC  . metoprolol succinate  25 mg Oral Daily  . pantoprazole (PROTONIX) IV  40 mg Intravenous QHS  . sertraline  50 mg Oral Daily   Continuous Infusions:      Time spent: 35 minutes    [facility]  [facility] Pager [phone] If 7PM-7AM, please contact night-coverage at [email or web address], password [facility] 05/29/2014, 12:17 PM  LOS: 2 days

## 2014-05-30 LAB — COMPREHENSIVE METABOLIC PANEL
ALT: 9 U/L (ref 0–35)
AST: 13 U/L (ref 0–37)
Albumin: 3.5 g/dL (ref 3.5–5.2)
Alkaline Phosphatase: 73 U/L (ref 39–117)
Anion gap: 6 (ref 5–15)
BUN: 10 mg/dL (ref 6–23)
CALCIUM: 8.9 mg/dL (ref 8.4–10.5)
CHLORIDE: 109 meq/L (ref 96–112)
CO2: 25 mmol/L (ref 19–32)
CREATININE: 0.71 mg/dL (ref 0.50–1.10)
GFR, EST NON AFRICAN AMERICAN: 87 mL/min — AB (ref >90)
Glucose, Bld: 123 mg/dL — ABNORMAL HIGH (ref 70–99)
POTASSIUM: 4 mmol/L (ref 3.5–5.1)
SODIUM: 140 mmol/L (ref 135–145)
Total Bilirubin: 0.4 mg/dL (ref 0.3–1.2)
Total Protein: 6.2 g/dL (ref 6.0–8.3)

## 2014-05-30 LAB — CBC
HCT: 27.6 % — ABNORMAL LOW (ref 36.0–46.0)
Hemoglobin: 8.9 g/dL — ABNORMAL LOW (ref 12.0–15.0)
MCH: 25.9 pg — AB (ref 26.0–34.0)
MCHC: 32.2 g/dL (ref 30.0–36.0)
MCV: 80.2 fL (ref 78.0–100.0)
PLATELETS: 208 10*3/uL (ref 150–400)
RBC: 3.44 MIL/uL — AB (ref 3.87–5.11)
RDW: 15.4 % (ref 11.5–15.5)
WBC: 4.7 10*3/uL (ref 4.0–10.5)

## 2014-05-30 LAB — OCCULT BLOOD, POC DEVICE: FECAL OCCULT BLD: POSITIVE — AB

## 2014-05-30 LAB — GLUCOSE, CAPILLARY: Glucose-Capillary: 126 mg/dL — ABNORMAL HIGH (ref 70–99)

## 2014-05-30 MED ORDER — ATORVASTATIN CALCIUM 20 MG PO TABS
20.0000 mg | ORAL_TABLET | Freq: Every day | ORAL | Status: DC
Start: 2014-05-30 — End: 2021-12-24

## 2014-05-30 NOTE — Progress Notes (Signed)
Pt seen, up and walking around in room, no dizziness or abd pn.  Non-bloody liq BM this a.m.  Hgb stable.  C/o hemorrhoidal irritation--advised pt it's ok to use her usual outpt hemorrhoidal ointment.  Also advised pt that, in my experience, once post-polypectomy bleeding stops, it tends to stay stopped and not recur.  Agree w/ no ASA for 2 wks.  Have left VM w/ Dr. Paulita Fujita to see if he wants to see pt back himself in office follow-up.  Cleotis Nipper, M.D. [phone]

## 2014-05-30 NOTE — Discharge Summary (Signed)
Physician Discharge Summary  Donna Wu ZDG:[id number] DOB: 1945/08/03 DOA: 05/27/2014  PCP: Donna Coma, MD  Admit date: 05/27/2014 Discharge date: 05/30/2014  Time spent: 40 minutes  Recommendations for Outpatient Follow-up:  1. Follow-up with primary care physician within one week. 2. Check CBC in 1 week.  Discharge Diagnoses:  Principal Problem:   Lower GI bleed Active Problems:   Essential hypertension   Hyperlipidemia   Diabetes mellitus type 2, controlled   S/P colonoscopic polypectomy   Discharge Condition: Stable  Diet recommendation: Carb modified diet  Filed Weights   05/27/14 2230  Weight: 80.831 kg (178 lb 3.2 oz)    History of present illness:  Donna Wu is a 69 y.o. female with history of diabetes mellitus, hyperlipidemia and hypertension who has had a colonoscopy 2 days ago presents to the ER because of rectal bleeding. Patient has had 3-4 episodes of intermittent bleeding episodes. Denies any abdominal pain nausea vomiting. Patient's colonoscopy showed mild diverticulosis and also had 6 polypectomies. Gastroenterology Dr. Watt Climes was notified by the ER physician and they will be seeing patient in consult patient has been admitted for further management. Patient is hemodynamically stable. He denies taking any NSAIDs. Medication list shows that patient is on aspirin. Patient denies any chest pain or shortness of breath.  Hospital Course:   Lower GI bleed Patient presented to the hospital with bright red blood per rectum. Had colonoscopy on Thursday 1/7 with 5 polyps removal, start bleeding after that. Patient seen by GI, recommended monitoring. Diet advanced, patient bleeding almost resolved, the dark colored stools and blood clots or very light on discharge. Advised not to take aspirin, of note patient was not on Coumadin and this was listed by mistake.  Essential hypertension Stable, no changes in medications.  Hyperlipidemia Continue home  medications, switch to take Lipitor at night.  Diabetes mellitus type 2 Controlled, placed on SSI while in the hospital, restarted home medications on discharge  Hypokalemia Replace with oral supplements.   Procedures:  None  Consultations:  [facility], [facility]  Discharge Exam: Filed Vitals:   05/30/14 0557  BP: 124/67  Pulse: 80  Temp: 98.1 F (36.7 C)  Resp: 18   General: Alert and awake, oriented x3, not in any acute distress. HEENT: anicteric sclera, pupils reactive to light and accommodation, EOMI CVS: S1-S2 clear, no murmur rubs or gallops Chest: clear to auscultation bilaterally, no wheezing, rales or rhonchi Abdomen: soft nontender, nondistended, normal bowel sounds, no organomegaly Extremities: no cyanosis, clubbing or edema noted bilaterally Neuro: Cranial nerves II-XII intact, no focal neurological deficits  Discharge Instructions   Discharge Instructions    Diet Carb Modified    Complete by:  As directed      Increase activity slowly    Complete by:  As directed           Current Discharge Medication List    CONTINUE these medications which have CHANGED   Details  atorvastatin (LIPITOR) 20 MG tablet Take 1 tablet (20 mg total) by mouth daily at 6 PM.      CONTINUE these medications which have NOT CHANGED   Details  benazepril (LOTENSIN) 10 MG tablet Take 0.5 tablets (5 mg total) by mouth daily. Qty: 30 tablet, Refills: 2    cetirizine (ZYRTEC) 10 MG tablet Take 10 mg by mouth daily.    Cholecalciferol (VITAMIN D3) 5000 UNITS CAPS Take 1 capsule by mouth daily.    hydrochlorothiazide (HYDRODIURIL) 25 MG tablet Take 1 tablet (25 mg total) by  mouth daily. Qty: 30 tablet, Refills: 2    LORazepam (ATIVAN) 0.5 MG tablet Take 0.5 mg by mouth 3 (three) times daily as needed. For anxiety    metoprolol succinate (TOPROL-XL) 25 MG 24 hr tablet Take 25 mg by mouth daily.    !! omeprazole (PRILOSEC) 20 MG capsule Take 1 capsule (20 mg total) by mouth  daily. Qty: 30 capsule, Refills: 2    !! omeprazole (PRILOSEC) 40 MG capsule Take 40 mg by mouth daily.    sertraline (ZOLOFT) 50 MG tablet Take 50 mg by mouth daily.    docusate sodium 100 MG CAPS Take 100 mg by mouth 2 (two) times daily. Qty: 20 capsule, Refills: 0    metFORMIN (GLUCOPHAGE) 500 MG tablet Take 1 tablet (500 mg total) by mouth 2 (two) times daily with a meal. Qty: 60 tablet, Refills: 2     !! - Potential duplicate medications found. Please discuss with provider.    STOP taking these medications     aspirin EC 81 MG tablet      warfarin (COUMADIN) 5 MG tablet        Allergies  Allergen Reactions  . Metformin And Related Diarrhea   Follow-up Information    Follow up with Donna Coma, MD In 1 week.   Specialty:  Family Medicine   Contact information:   [address] [place] [address] [phone]        The results of significant diagnostics from this hospitalization (including imaging, microbiology, ancillary and laboratory) are listed below for reference.    Significant Diagnostic Studies: Dg Abd Acute W/chest  05/27/2014   CLINICAL DATA:  Rectal bleeding for 1 day. Recent colonic polyp removal. Abdominal cramping  EXAM: ACUTE ABDOMEN SERIES (ABDOMEN 2 VIEW & CHEST 1 VIEW)  COMPARISON:  Chest radiograph March 24, 2012  FINDINGS: PA chest: There is no edema or consolidation. Heart size and pulmonary vascularity are normal. No adenopathy. There is degenerative change in the thoracic spine with mid thoracic levoscoliosis.  Supine and upright abdomen: There is fairly diffuse stool throughout colon. There is no bowel dilatation. There are, however, scattered air-fluid levels on the right. No free air. There are phleboliths in the pelvis.  IMPRESSION: Scattered air-fluid levels. Suspect enteritis or early ileus. A degree of obstruction is possible but felt to be less likely. No free air. No lung edema or consolidation. Fairly diffuse  stool throughout colon.   Electronically Signed   By: Lowella Grip M.D.   On: 05/27/2014 19:31    Microbiology: No results found for this or any previous visit (from the past 240 hour(s)).   Labs: Basic Metabolic Panel:  Recent Labs Lab 05/27/14 1820 05/28/14 0440 05/30/14 0439  NA 137 137 140  K 3.4* 3.4* 4.0  CL 103 105 109  CO2 24 25 25   GLUCOSE 109* 104* 123*  BUN 12 13 10   CREATININE 0.75 0.65 0.71  CALCIUM 9.3 8.6 8.9   Liver Function Tests:  Recent Labs Lab 05/27/14 1820 05/28/14 0440 05/30/14 0439  AST 17 11 13   ALT 12 9 9   ALKPHOS 83 67 73  BILITOT 0.7 0.5 0.4  PROT 7.2 6.1 6.2  ALBUMIN 4.1 3.4* 3.5   No results for input(s): LIPASE, AMYLASE in the last 168 hours. No results for input(s): AMMONIA in the last 168 hours. CBC:  Recent Labs Lab 05/27/14 1820 05/28/14 0105 05/28/14 0417 05/28/14 0818 05/30/14 0439  WBC 6.5 6.0 5.0 5.1 4.7  NEUTROABS 4.0  --   --   --   --   HGB 10.0* 8.3* 8.2* 8.8* 8.9*  HCT 30.8* 25.7* 25.2* 26.9* 27.6*  MCV 78.8 79.6 79.5 79.4 80.2  PLT 231 204 195 196 208   Cardiac Enzymes: No results for input(s): CKTOTAL, CKMB, CKMBINDEX, TROPONINI in the last 168 hours. BNP: BNP (last 3 results) No results for input(s): PROBNP in the last 8760 hours. CBG:  Recent Labs Lab 05/29/14 1157 05/29/14 1239 05/29/14 1741 05/29/14 2141 05/30/14 0751  GLUCAP 100* 91 107* 130* 126*       Signed:  Nyx Keady A  [facility] 05/30/2014, 9:06 AM

## 2014-05-31 LAB — TYPE AND SCREEN
ABO/RH(D): O POS
ANTIBODY SCREEN: NEGATIVE
Unit division: 0

## 2014-11-24 ENCOUNTER — Other Ambulatory Visit: Payer: Self-pay

## 2014-11-24 DIAGNOSIS — Z1231 Encounter for screening mammogram for malignant neoplasm of breast: Secondary | ICD-10-CM

## 2014-12-27 ENCOUNTER — Ambulatory Visit
Admission: RE | Admit: 2014-12-27 | Discharge: 2014-12-27 | Disposition: A | Discharge status: Home / Self Care | Payer: Medicare Other | Source: Ambulatory Visit

## 2014-12-27 DIAGNOSIS — Z1231 Encounter for screening mammogram for malignant neoplasm of breast: Secondary | ICD-10-CM

## 2015-12-06 ENCOUNTER — Other Ambulatory Visit: Payer: Self-pay | Admitting: Family Medicine

## 2015-12-06 DIAGNOSIS — Z1231 Encounter for screening mammogram for malignant neoplasm of breast: Secondary | ICD-10-CM

## 2015-12-29 ENCOUNTER — Ambulatory Visit
Admission: RE | Admit: 2015-12-29 | Discharge: 2015-12-29 | Disposition: A | Discharge status: Home / Self Care | Payer: Medicare Other | Source: Ambulatory Visit | Attending: Family Medicine | Admitting: Family Medicine

## 2015-12-29 DIAGNOSIS — Z1231 Encounter for screening mammogram for malignant neoplasm of breast: Secondary | ICD-10-CM

## 2016-11-27 ENCOUNTER — Other Ambulatory Visit: Payer: Self-pay | Admitting: Family Medicine

## 2016-11-27 DIAGNOSIS — Z1231 Encounter for screening mammogram for malignant neoplasm of breast: Secondary | ICD-10-CM

## 2016-12-05 ENCOUNTER — Ambulatory Visit (INDEPENDENT_AMBULATORY_CARE_PROVIDER_SITE_OTHER): Payer: Medicare Other | Admitting: Orthopedic Surgery

## 2016-12-05 ENCOUNTER — Ambulatory Visit (INDEPENDENT_AMBULATORY_CARE_PROVIDER_SITE_OTHER): Payer: Medicare Other

## 2016-12-05 ENCOUNTER — Encounter (INDEPENDENT_AMBULATORY_CARE_PROVIDER_SITE_OTHER): Payer: Self-pay | Admitting: Orthopedic Surgery

## 2016-12-05 DIAGNOSIS — M1711 Unilateral primary osteoarthritis, right knee: Secondary | ICD-10-CM

## 2016-12-06 DIAGNOSIS — M1711 Unilateral primary osteoarthritis, right knee: Secondary | ICD-10-CM

## 2016-12-06 MED ORDER — METHYLPREDNISOLONE ACETATE 40 MG/ML IJ SUSP
40.0000 mg | INTRAMUSCULAR | Status: AC | PRN
  Administered 2016-12-06: 40 mg via INTRA_ARTICULAR

## 2016-12-06 MED ORDER — LIDOCAINE HCL 1 % IJ SOLN
5.0000 mL | INTRAMUSCULAR | Status: AC | PRN
  Administered 2016-12-06: 5 mL

## 2016-12-06 MED ORDER — BUPIVACAINE HCL 0.25 % IJ SOLN
4.0000 mL | INTRAMUSCULAR | Status: AC | PRN
  Administered 2016-12-06: 4 mL via INTRA_ARTICULAR

## 2016-12-06 NOTE — Progress Notes (Signed)
Office Visit Note   Patient: Donna Wu           Date of Birth: 1946-04-08           MRN: [id number] Visit Date: 12/05/2016 Requested by: [name], MD [address], [place] [address] PCP: [name], MD  Subjective: Chief Complaint  Patient presents with  . Right Knee - Pain    HPI: Donna Wu is a 71 year old patient with right knee pain.  Patient has had constant pain in the right knee for years and is getting worse.  She's had good result from left total knee replacement done 5 years ago.  Tylenol for pain helps a little bit.  Pain is worse after activity.  It will wake her from sleep at night.  She describes decreased walking endurance.              ROS: All systems reviewed are negative as they relate to the chief complaint within the history of present illness.  Patient denies  fevers or chills.   Assessment & Plan: Visit Diagnoses:  1. Unilateral primary osteoarthritis, right knee     Plan: Impression is endstage right knee arthritis.  She's had good result from left total knee replacement.  Plan is right knee injection.  See her back in 6 weeks and we may decide then to proceed with knee replacement.  We'll see what kind of relief she gets from this injection.  Continue with nonweightbearing quad strengthening exercises.  Follow-Up Instructions: Return in about 6 weeks (around 01/16/2017).   Orders:  Orders Placed This Encounter  Procedures  . XR KNEE 3 VIEW RIGHT   No orders of the defined types were placed in this encounter.     Procedures: Large Joint Inj Date/Time: 12/06/2016 11:11 PM Performed by: Meredith Pel Authorized by: Meredith Pel   Consent Given by:  Patient Site marked: the procedure site was marked   Timeout: prior to procedure the correct patient, procedure, and site was verified   Indications:  Pain, joint swelling and diagnostic evaluation Location:  Knee Site:  R knee Prep: patient was  prepped and draped in usual sterile fashion   Needle Size:  18 G Needle Length:  1.5 inches Approach:  Superolateral Ultrasound Guidance: No   Fluoroscopic Guidance: No   Arthrogram: No   Medications:  5 mL lidocaine 1 %; 4 mL bupivacaine 0.25 %; 40 mg methylPREDNISolone acetate 40 MG/ML Patient tolerance:  Patient tolerated the procedure well with no immediate complications     Clinical Data: No additional findings.  Objective: Vital Signs: There were no vitals taken for this visit.  Physical Exam:   Constitutional: Patient appears well-developed HEENT:  Head: Normocephalic Eyes:EOM are normal Neck: Normal range of motion Cardiovascular: Normal rate Pulmonary/chest: Effort normal Neurologic: Patient is alert Skin: Skin is warm Psychiatric: Patient has normal mood and affect    Ortho Exam: Orthopedic exam demonstrates slightly antalgic gait to the right palpable pedal pulses no groin pain with internal/external rotation leg range of motion includes about a 5 flexion contracture but she does have flexion past 90.  Extensor mechanism is intact.  No groin pain with internal ROTATION leg.  No other masses lymph adenopathy or skin changes noted in the right knee region  Specialty Comments:  No specialty comments available.  Imaging: No results found.   PMFS History: Patient Active Problem List   Diagnosis Date Noted  . Essential hypertension 05/28/2014  . Hyperlipidemia  05/28/2014  . Diabetes mellitus type 2, controlled ([place]) 05/28/2014  . S/P colonoscopic polypectomy   . Lower GI bleed 05/27/2014   Past Medical History:  Diagnosis Date  . Anxiety   . Arthritis   . Bronchitis    hx of  . Cataracts, bilateral   . Colon polyps   . Diabetes mellitus without complication ([place])    on oral medicine  . GERD (gastroesophageal reflux disease)   . H/O hiatal hernia   . Hyperlipidemia   . Hypertension    Dr. [name]   . Pneumonia    "as a baby"    Family  History  Problem Relation Age of Onset  . Dementia Father   . Hypertension Mother   . Heart attack Mother 55  . Hypertension Brother   . Hypertension Brother   . Lung cancer Brother   . Hypertension Sister   . Hypertension Sister     Past Surgical History:  Procedure Laterality Date  . ABDOMINAL HYSTERECTOMY    . BREAST SURGERY    . CARDIOVASCULAR STRESS TEST     done approx. 7 years ago  . ESOPHAGOGASTRODUODENOSCOPY ENDOSCOPY    . TOTAL KNEE ARTHROPLASTY  03/31/2012   Procedure: TOTAL KNEE ARTHROPLASTY;  Surgeon: Meredith Pel, MD;  Location: [facility];  Service: Orthopedics;  Laterality: Left;  Left total knee arthroplasty  . TUBAL LIGATION     Social History   Occupational History  . Not on file.   Social History Main Topics  . Smoking status: Never Smoker  . Smokeless tobacco: Never Used  . Alcohol use No  . Drug use: No  . Sexual activity: Not on file

## 2016-12-30 ENCOUNTER — Ambulatory Visit: Payer: Medicare Other

## 2017-01-15 ENCOUNTER — Ambulatory Visit (INDEPENDENT_AMBULATORY_CARE_PROVIDER_SITE_OTHER): Payer: Medicare Other | Admitting: Orthopedic Surgery

## 2017-01-16 ENCOUNTER — Ambulatory Visit
Admission: RE | Admit: 2017-01-16 | Discharge: 2017-01-16 | Disposition: A | Discharge status: Home / Self Care | Payer: Medicare Other | Source: Ambulatory Visit | Attending: Family Medicine | Admitting: Family Medicine

## 2017-01-16 DIAGNOSIS — Z1231 Encounter for screening mammogram for malignant neoplasm of breast: Secondary | ICD-10-CM

## 2017-12-26 ENCOUNTER — Other Ambulatory Visit: Payer: Self-pay | Admitting: Family Medicine

## 2017-12-26 DIAGNOSIS — Z1231 Encounter for screening mammogram for malignant neoplasm of breast: Secondary | ICD-10-CM

## 2018-01-30 ENCOUNTER — Ambulatory Visit
Admission: RE | Admit: 2018-01-30 | Discharge: 2018-01-30 | Disposition: A | Discharge status: Home / Self Care | Payer: Medicare Other | Source: Ambulatory Visit | Attending: Family Medicine | Admitting: Family Medicine

## 2018-01-30 DIAGNOSIS — Z1231 Encounter for screening mammogram for malignant neoplasm of breast: Secondary | ICD-10-CM

## 2018-02-03 ENCOUNTER — Other Ambulatory Visit: Payer: Self-pay | Admitting: Family Medicine

## 2018-02-03 DIAGNOSIS — R928 Other abnormal and inconclusive findings on diagnostic imaging of breast: Secondary | ICD-10-CM

## 2018-02-06 ENCOUNTER — Other Ambulatory Visit: Payer: Self-pay | Admitting: Family Medicine

## 2018-02-06 ENCOUNTER — Ambulatory Visit
Admission: RE | Admit: 2018-02-06 | Discharge: 2018-02-06 | Disposition: A | Discharge status: Home / Self Care | Payer: Medicare Other | Source: Ambulatory Visit | Attending: Family Medicine | Admitting: Family Medicine

## 2018-02-06 DIAGNOSIS — R928 Other abnormal and inconclusive findings on diagnostic imaging of breast: Secondary | ICD-10-CM

## 2018-02-06 DIAGNOSIS — R599 Enlarged lymph nodes, unspecified: Secondary | ICD-10-CM

## 2018-02-10 ENCOUNTER — Ambulatory Visit
Admission: RE | Admit: 2018-02-10 | Discharge: 2018-02-10 | Disposition: A | Discharge status: Home / Self Care | Payer: Medicare Other | Source: Ambulatory Visit | Attending: Family Medicine | Admitting: Family Medicine

## 2018-02-10 ENCOUNTER — Other Ambulatory Visit (HOSPITAL_COMMUNITY)
Admission: RE | Admit: 2018-02-10 | Disposition: A | Discharge status: Home / Self Care | Payer: Medicare Other | Source: Ambulatory Visit | Attending: Diagnostic Radiology | Admitting: Diagnostic Radiology

## 2018-02-10 ENCOUNTER — Other Ambulatory Visit: Payer: Self-pay | Admitting: Family Medicine

## 2018-02-10 DIAGNOSIS — R599 Enlarged lymph nodes, unspecified: Secondary | ICD-10-CM

## 2018-04-28 ENCOUNTER — Other Ambulatory Visit: Payer: Self-pay | Admitting: Family Medicine

## 2018-04-28 DIAGNOSIS — E2839 Other primary ovarian failure: Secondary | ICD-10-CM

## 2018-07-02 ENCOUNTER — Ambulatory Visit
Admission: RE | Admit: 2018-07-02 | Discharge: 2018-07-02 | Disposition: A | Discharge status: Home / Self Care | Payer: Medicare Other | Source: Ambulatory Visit | Attending: Family Medicine | Admitting: Family Medicine

## 2018-07-02 DIAGNOSIS — E2839 Other primary ovarian failure: Secondary | ICD-10-CM

## 2019-01-29 ENCOUNTER — Other Ambulatory Visit: Payer: Self-pay | Admitting: Family Medicine

## 2019-01-29 DIAGNOSIS — Z1231 Encounter for screening mammogram for malignant neoplasm of breast: Secondary | ICD-10-CM

## 2019-03-17 ENCOUNTER — Other Ambulatory Visit: Payer: Self-pay

## 2019-03-17 ENCOUNTER — Ambulatory Visit
Admission: RE | Admit: 2019-03-17 | Discharge: 2019-03-17 | Disposition: A | Discharge status: Home / Self Care | Payer: Medicare Other | Source: Ambulatory Visit | Attending: Family Medicine | Admitting: Family Medicine

## 2019-03-17 DIAGNOSIS — Z1231 Encounter for screening mammogram for malignant neoplasm of breast: Secondary | ICD-10-CM

## 2020-02-07 ENCOUNTER — Other Ambulatory Visit: Payer: Self-pay | Admitting: Family Medicine

## 2020-02-07 DIAGNOSIS — Z1231 Encounter for screening mammogram for malignant neoplasm of breast: Secondary | ICD-10-CM

## 2020-03-17 ENCOUNTER — Ambulatory Visit: Payer: Medicare Other

## 2020-04-21 ENCOUNTER — Other Ambulatory Visit: Payer: Self-pay

## 2020-04-21 ENCOUNTER — Ambulatory Visit
Admission: RE | Admit: 2020-04-21 | Discharge: 2020-04-21 | Disposition: A | Discharge status: Home / Self Care | Payer: Medicare Other | Source: Ambulatory Visit | Attending: Family Medicine | Admitting: Family Medicine

## 2020-04-21 DIAGNOSIS — Z1231 Encounter for screening mammogram for malignant neoplasm of breast: Secondary | ICD-10-CM

## 2020-07-17 DIAGNOSIS — E119 Type 2 diabetes mellitus without complications: Secondary | ICD-10-CM | POA: Diagnosis not present

## 2020-07-28 DIAGNOSIS — Z0000 Encounter for general adult medical examination without abnormal findings: Secondary | ICD-10-CM | POA: Diagnosis not present

## 2020-07-28 DIAGNOSIS — K219 Gastro-esophageal reflux disease without esophagitis: Secondary | ICD-10-CM | POA: Diagnosis not present

## 2020-07-28 DIAGNOSIS — M1990 Unspecified osteoarthritis, unspecified site: Secondary | ICD-10-CM | POA: Diagnosis not present

## 2020-07-28 DIAGNOSIS — I10 Essential (primary) hypertension: Secondary | ICD-10-CM | POA: Diagnosis not present

## 2020-07-28 DIAGNOSIS — N182 Chronic kidney disease, stage 2 (mild): Secondary | ICD-10-CM | POA: Diagnosis not present

## 2020-07-28 DIAGNOSIS — Z86010 Personal history of colonic polyps: Secondary | ICD-10-CM | POA: Diagnosis not present

## 2020-07-28 DIAGNOSIS — E785 Hyperlipidemia, unspecified: Secondary | ICD-10-CM | POA: Diagnosis not present

## 2020-07-28 DIAGNOSIS — E559 Vitamin D deficiency, unspecified: Secondary | ICD-10-CM | POA: Diagnosis not present

## 2020-07-28 DIAGNOSIS — L80 Vitiligo: Secondary | ICD-10-CM | POA: Diagnosis not present

## 2020-07-28 DIAGNOSIS — E1122 Type 2 diabetes mellitus with diabetic chronic kidney disease: Secondary | ICD-10-CM | POA: Diagnosis not present

## 2020-07-28 DIAGNOSIS — D509 Iron deficiency anemia, unspecified: Secondary | ICD-10-CM | POA: Diagnosis not present

## 2020-07-28 DIAGNOSIS — Z79899 Other long term (current) drug therapy: Secondary | ICD-10-CM | POA: Diagnosis not present

## 2020-07-31 DIAGNOSIS — D509 Iron deficiency anemia, unspecified: Secondary | ICD-10-CM | POA: Diagnosis not present

## 2020-08-14 ENCOUNTER — Telehealth: Payer: Self-pay | Admitting: Hematology and Oncology

## 2020-08-14 NOTE — Telephone Encounter (Signed)
Received a new hem referral from Dr. Stephanie Acre for high free-light chains ratio, r/o MGUS. Donna Wu has been cld and scheduled to see Dr. Chryl Heck on 3/29 at 10am. Pt aware to arrive 20 minutes early.

## 2020-08-15 ENCOUNTER — Inpatient Hospital Stay: Payer: Medicare Other | Attending: Hematology and Oncology | Admitting: Hematology and Oncology

## 2020-08-15 ENCOUNTER — Inpatient Hospital Stay: Payer: Medicare Other

## 2020-08-15 ENCOUNTER — Encounter: Payer: Self-pay | Admitting: Hematology and Oncology

## 2020-08-15 ENCOUNTER — Other Ambulatory Visit: Payer: Self-pay

## 2020-08-15 ENCOUNTER — Telehealth: Payer: Self-pay | Admitting: Hematology and Oncology

## 2020-08-15 VITALS — BP 146/65 | HR 98 | Temp 98.3°F | Resp 18 | Ht 63.0 in | Wt 187.2 lb

## 2020-08-15 DIAGNOSIS — Z79899 Other long term (current) drug therapy: Secondary | ICD-10-CM | POA: Diagnosis not present

## 2020-08-15 DIAGNOSIS — K219 Gastro-esophageal reflux disease without esophagitis: Secondary | ICD-10-CM | POA: Insufficient documentation

## 2020-08-15 DIAGNOSIS — I10 Essential (primary) hypertension: Secondary | ICD-10-CM | POA: Diagnosis not present

## 2020-08-15 DIAGNOSIS — E785 Hyperlipidemia, unspecified: Secondary | ICD-10-CM | POA: Diagnosis not present

## 2020-08-15 DIAGNOSIS — Z7982 Long term (current) use of aspirin: Secondary | ICD-10-CM | POA: Diagnosis not present

## 2020-08-15 DIAGNOSIS — D649 Anemia, unspecified: Secondary | ICD-10-CM

## 2020-08-15 DIAGNOSIS — E1136 Type 2 diabetes mellitus with diabetic cataract: Secondary | ICD-10-CM | POA: Insufficient documentation

## 2020-08-15 LAB — RETICULOCYTES
Immature Retic Fract: 10.3 % (ref 2.3–15.9)
RBC.: 4.48 MIL/uL (ref 3.87–5.11)
Retic Count, Absolute: 71.7 10*3/uL (ref 19.0–186.0)
Retic Ct Pct: 1.6 % (ref 0.4–3.1)

## 2020-08-15 LAB — CMP (CANCER CENTER ONLY)
ALT: 11 U/L (ref 0–44)
AST: 15 U/L (ref 15–41)
Albumin: 4.4 g/dL (ref 3.5–5.0)
Alkaline Phosphatase: 99 U/L (ref 38–126)
Anion gap: 12 (ref 5–15)
BUN: 22 mg/dL (ref 8–23)
CO2: 26 mmol/L (ref 22–32)
Calcium: 9.5 mg/dL (ref 8.9–10.3)
Chloride: 100 mmol/L (ref 98–111)
Creatinine: 0.9 mg/dL (ref 0.44–1.00)
GFR, Estimated: >60 mL/min (ref >60)
Glucose, Bld: 119 mg/dL — ABNORMAL HIGH (ref 70–99)
Potassium: 3.7 mmol/L (ref 3.5–5.1)
Sodium: 138 mmol/L (ref 135–145)
Total Bilirubin: 0.5 mg/dL (ref 0.3–1.2)
Total Protein: 8.1 g/dL (ref 6.5–8.1)

## 2020-08-15 LAB — FERRITIN: Ferritin: 21 ng/mL (ref 11–307)

## 2020-08-15 LAB — CBC WITH DIFFERENTIAL/PLATELET
Abs Immature Granulocytes: 0.01 10*3/uL (ref 0.00–0.07)
Basophils Absolute: 0 10*3/uL (ref 0.0–0.1)
Basophils Relative: 1 %
Eosinophils Absolute: 0.1 10*3/uL (ref 0.0–0.5)
Eosinophils Relative: 2 %
HCT: 36.3 % (ref 36.0–46.0)
Hemoglobin: 11.6 g/dL — ABNORMAL LOW (ref 12.0–15.0)
Immature Granulocytes: 0 %
Lymphocytes Relative: 34 %
Lymphs Abs: 1.8 10*3/uL (ref 0.7–4.0)
MCH: 25.4 pg — ABNORMAL LOW (ref 26.0–34.0)
MCHC: 32 g/dL (ref 30.0–36.0)
MCV: 79.4 fL — ABNORMAL LOW (ref 80.0–100.0)
Monocytes Absolute: 0.3 10*3/uL (ref 0.1–1.0)
Monocytes Relative: 6 %
Neutro Abs: 3 10*3/uL (ref 1.7–7.7)
Neutrophils Relative %: 57 %
Platelets: 235 10*3/uL (ref 150–400)
RBC: 4.57 MIL/uL (ref 3.87–5.11)
RDW: 15.9 % — ABNORMAL HIGH (ref 11.5–15.5)
WBC: 5.3 10*3/uL (ref 4.0–10.5)
nRBC: 0 % (ref 0.0–0.2)

## 2020-08-15 LAB — VITAMIN B12: Vitamin B-12: 265 pg/mL (ref 180–914)

## 2020-08-15 LAB — IRON AND TIBC
Iron: 48 ug/dL (ref 41–142)
Saturation Ratios: 12 % — ABNORMAL LOW (ref 21–57)
TIBC: 406 ug/dL (ref 236–444)
UIBC: 358 ug/dL (ref 120–384)

## 2020-08-15 LAB — LACTATE DEHYDROGENASE: LDH: 176 U/L (ref 98–192)

## 2020-08-15 NOTE — Progress Notes (Signed)
[facility] NOTE  Patient Care Team: [name], MD as PCP - General (Family Medicine)  CHIEF COMPLAINTS/PURPOSE OF CONSULTATION:  Abnormal labs.  List of  ASSESSMENT & PLAN:  No problem-specific Assessment & Plan notes found for this encounter.  No orders of the defined types were placed in this encounter.  This is a very pleasant 75 year old female patient with past medical history significant for type 2 diabetes mellitus, hypertension, dyslipidemia, bilateral cataracts referred to hematology for evaluation of abnormal labs. She feels quite well, denies any health complaints.  She has had a longstanding history of anemia which was thought to be related to GI bleeding.  Her most recent hemoglobin was actually significantly better when compared to several years ago.  She denies any recent change in bowel habits, hematochezia or melena. Physical examination quite unremarkable.  We have reviewed labs which showed no evidence of monoclonal protein, mildly elevated kappa lambda ratio at 2.2, no elevated total protein, normal creatinine, no elevated calcium or alkaline phosphatase. Kappa lambda ratio up to 3.1 can be related to inflammation, hence I have recommended labs today for further evaluation, return to clinic in 2 weeks to discuss additional recommendations.  She is up-to-date with age-appropriate cancer screening.   Thank you for consulting [place] in the care of this patient.  Please not hesitate to contact us with any additional questions or concerns.  HISTORY OF PRESENTING ILLNESS:   Donna Wu 75 y.o. female is here because of abnormal labs  This is a very pleasant 75 year old female patient with past medical history significant for type 2 diabetes, hypertension, dyslipidemia, bilateral cataract referred to hematology for evaluation of abnormal labs which is primarily elevated kappa lambda ratio.  Ms. [name] arrived to the appointment today by herself.  She  feels quite well, denies any B symptoms such as unusual fatigue, drenching night sweats, loss of appetite.  She may have lost a little bit of weight about 7 pounds in the past several months.  She denies any changes in her breathing, bowel habits or urinary habits.  No new bone pains.  No new neurological complaints.  She had history of left knee replacement and has some arthritis in the right knee Rest of the pertinent 10 point ROS reviewed and unremarkable.  REVIEW OF SYSTEMS:   Constitutional: Denies fevers, chills or abnormal night sweats Eyes: Denies blurriness of vision, double vision or watery eyes Ears, nose, mouth, throat, and face: Denies mucositis or sore throat Respiratory: Denies cough, dyspnea or wheezes Cardiovascular: Denies palpitation, chest discomfort or lower extremity swelling Gastrointestinal:  Denies nausea, heartburn or change in bowel habits Skin: Denies abnormal skin rashes Lymphatics: Denies new lymphadenopathy or easy bruising Neurological:Denies numbness, tingling or new weaknesses Behavioral/Psych: Mood is stable, no new changes  All other systems were reviewed with the patient and are negative.  MEDICAL HISTORY:  Past Medical History:  Diagnosis Date  . Anxiety   . Arthritis   . Bronchitis    hx of  . Cataracts, bilateral   . Colon polyps   . Diabetes mellitus without complication ([place])    on oral medicine  . GERD (gastroesophageal reflux disease)   . H/O hiatal hernia   . Hyperlipidemia   . Hypertension    Dr. [name]   . Pneumonia    "as a baby"    SURGICAL HISTORY: Past Surgical History:  Procedure Laterality Date  . ABDOMINAL HYSTERECTOMY    . BREAST BIOPSY Right  No Scar visible   . BREAST LUMPECTOMY  app. 40 years ago  . BREAST SURGERY    . CARDIOVASCULAR STRESS TEST     done approx. 7 years ago  . ESOPHAGOGASTRODUODENOSCOPY ENDOSCOPY    . TOTAL KNEE ARTHROPLASTY  03/31/2012   Procedure: TOTAL KNEE ARTHROPLASTY;  Surgeon:  Meredith Pel, MD;  Location: [facility];  Service: Orthopedics;  Laterality: Left;  Left total knee arthroplasty  . TUBAL LIGATION      SOCIAL HISTORY: Social History   Socioeconomic History  . Marital status: Legally Separated    Spouse name: Not on file  . Number of children: 4  . Years of education: Not on file  . Highest education level: Not on file  Occupational History  . Not on file  Tobacco Use  . Smoking status: Never Smoker  . Smokeless tobacco: Never Used  Substance and Sexual Activity  . Alcohol use: No  . Drug use: No  . Sexual activity: Not on file  Other Topics Concern  . Not on file  Social History Narrative  . Not on file   Social Determinants of Health   Financial Resource Strain: Not on file  Food Insecurity: Not on file  Transportation Needs: Not on file  Physical Activity: Not on file  Stress: Not on file  Social Connections: Not on file  Intimate Partner Violence: Not on file    FAMILY HISTORY: Family History  Problem Relation Age of Onset  . Hypertension Mother   . Heart attack Mother 14  . Dementia Father   . Hypertension Brother   . Hypertension Brother   . Lung cancer Brother   . Hypertension Sister   . Hypertension Sister   . Breast cancer Neg Hx     ALLERGIES:  is allergic to metformin and related.  MEDICATIONS:  Current Outpatient Medications  Medication Sig Dispense Refill  . aspirin 81 MG chewable tablet 1 tablet    . atorvastatin (LIPITOR) 20 MG tablet Take 1 tablet (20 mg total) by mouth daily at 6 PM.    . benazepril (LOTENSIN) 10 MG tablet Take 0.5 tablets (5 mg total) by mouth daily. (Patient taking differently: Take 10 mg by mouth daily.) 30 tablet 2  . Cholecalciferol (VITAMIN D3) 5000 UNITS CAPS Take 1 capsule by mouth daily.    . diclofenac Sodium (VOLTAREN) 1 % GEL See admin instructions.    . hydrochlorothiazide (HYDRODIURIL) 25 MG tablet Take 1 tablet (25 mg total) by mouth daily. 30 tablet 2  . Iron-FA-B  Cmp-C-Biot-Probiotic (FUSION PLUS) CAPS Take 1 capsule by mouth daily.    Marland Kitchen LORazepam (ATIVAN) 0.5 MG tablet Take 0.5 mg by mouth 3 (three) times daily as needed. For anxiety    . metoprolol succinate (TOPROL-XL) 25 MG 24 hr tablet Take 25 mg by mouth daily.    Marland Kitchen omeprazole (PRILOSEC) 40 MG capsule Take 40 mg by mouth daily.    . sertraline (ZOLOFT) 50 MG tablet Take 50 mg by mouth daily.    . Vitamin D, Ergocalciferol, (DRISDOL) 1.25 MG (50000 UNIT) CAPS capsule Take 50,000 Units by mouth once a week.     No current facility-administered medications for this visit.     PHYSICAL EXAMINATION: ECOG PERFORMANCE STATUS: 0 - Asymptomatic  Vitals:   08/15/20 1013  BP: (!) 146/65  Pulse: 98  Resp: 18  Temp: 98.3 F (36.8 C)  SpO2: 100%   Filed Weights   08/15/20 1013  Weight: 187 lb 3.2 oz (  84.9 kg)    GENERAL:alert, no distress and comfortable SKIN: skin color, texture, turgor are normal, no rashes or significant lesions EYES: normal, conjunctiva are pink and non-injected, sclera clear OROPHARYNX:no exudate, no erythema and lips, buccal mucosa, and tongue normal  NECK: supple, thyroid normal size, non-tender, without nodularity LYMPH:  no palpable lymphadenopathy in the cervical, axillary or inguinal LUNGS: clear to auscultation and percussion with normal breathing effort HEART: regular rate & rhythm and no murmurs and no lower extremity edema ABDOMEN:abdomen soft, non-tender and normal bowel sounds Musculoskeletal:no cyanosis of digits and no clubbing  PSYCH: alert & oriented x 3 with fluent speech NEURO: no focal motor/sensory deficits  LABORATORY DATA:  I have reviewed the data as listed Lab Results  Component Value Date   WBC 4.7 05/30/2014   HGB 8.9 (L) 05/30/2014   HCT 27.6 (L) 05/30/2014   MCV 80.2 05/30/2014   PLT 208 05/30/2014     Chemistry      Component Value Date/Time   NA 140 05/30/2014 0439   K 4.0 05/30/2014 0439   CL 109 05/30/2014 0439   CO2 25  05/30/2014 0439   BUN 10 05/30/2014 0439   CREATININE 0.71 05/30/2014 0439      Component Value Date/Time   CALCIUM 8.9 05/30/2014 0439   ALKPHOS 73 05/30/2014 0439   AST 13 05/30/2014 0439   ALT 9 05/30/2014 0439   BILITOT 0.4 05/30/2014 0439     RADIOGRAPHIC STUDIES: I have personally reviewed the radiological images as listed and agreed with the findings in the report. No results found.  All questions were answered. The patient knows to call the clinic with any problems, questions or concerns. I spent 35 minutes in the care of this patient including H and P, review of records, counseling and coordination of care.     Benay Pike, MD 08/15/2020 10:37 AM

## 2020-08-15 NOTE — Telephone Encounter (Signed)
Scheduled follow-up appointment per 3/29 los. Patient is aware. ?

## 2020-08-16 LAB — FOLATE RBC
Folate, Hemolysate: 288 ng/mL
Folate, RBC: 778 ng/mL (ref >498)
Hematocrit: 37 % (ref 34.0–46.6)

## 2020-08-16 LAB — IGG, IGA, IGM
IgA: 94 mg/dL (ref 64–422)
IgG (Immunoglobin G), Serum: 1311 mg/dL (ref 586–1602)
IgM (Immunoglobulin M), Srm: 86 mg/dL (ref 26–217)

## 2020-08-16 LAB — KAPPA/LAMBDA LIGHT CHAINS
Kappa free light chain: 21.6 mg/L — ABNORMAL HIGH (ref 3.3–19.4)
Kappa, lambda light chain ratio: 2.3 — ABNORMAL HIGH (ref 0.26–1.65)
Lambda free light chains: 9.4 mg/L (ref 5.7–26.3)

## 2020-08-17 LAB — PROTEIN ELECTROPHORESIS, SERUM, WITH REFLEX
A/G Ratio: 1.2 (ref 0.7–1.7)
Albumin ELP: 4.1 g/dL (ref 2.9–4.4)
Alpha-1-Globulin: 0.3 g/dL (ref 0.0–0.4)
Alpha-2-Globulin: 0.8 g/dL (ref 0.4–1.0)
Beta Globulin: 1 g/dL (ref 0.7–1.3)
Gamma Globulin: 1.3 g/dL (ref 0.4–1.8)
Globulin, Total: 3.4 g/dL (ref 2.2–3.9)
Total Protein ELP: 7.5 g/dL (ref 6.0–8.5)

## 2020-08-30 ENCOUNTER — Inpatient Hospital Stay: Payer: Medicare Other

## 2020-08-30 ENCOUNTER — Other Ambulatory Visit: Payer: Self-pay

## 2020-08-30 ENCOUNTER — Inpatient Hospital Stay: Payer: Medicare Other | Attending: Hematology and Oncology | Admitting: Hematology and Oncology

## 2020-08-30 ENCOUNTER — Encounter: Payer: Self-pay | Admitting: Hematology and Oncology

## 2020-08-30 VITALS — BP 143/63 | HR 87 | Temp 97.5°F | Resp 20 | Ht 63.0 in | Wt 229.5 lb

## 2020-08-30 DIAGNOSIS — D649 Anemia, unspecified: Secondary | ICD-10-CM

## 2020-08-30 DIAGNOSIS — E785 Hyperlipidemia, unspecified: Secondary | ICD-10-CM | POA: Insufficient documentation

## 2020-08-30 DIAGNOSIS — F419 Anxiety disorder, unspecified: Secondary | ICD-10-CM | POA: Insufficient documentation

## 2020-08-30 DIAGNOSIS — M1990 Unspecified osteoarthritis, unspecified site: Secondary | ICD-10-CM | POA: Insufficient documentation

## 2020-08-30 DIAGNOSIS — E1140 Type 2 diabetes mellitus with diabetic neuropathy, unspecified: Secondary | ICD-10-CM | POA: Diagnosis not present

## 2020-08-30 DIAGNOSIS — I10 Essential (primary) hypertension: Secondary | ICD-10-CM | POA: Diagnosis not present

## 2020-08-30 DIAGNOSIS — Z79899 Other long term (current) drug therapy: Secondary | ICD-10-CM | POA: Insufficient documentation

## 2020-08-30 DIAGNOSIS — K219 Gastro-esophageal reflux disease without esophagitis: Secondary | ICD-10-CM | POA: Diagnosis not present

## 2020-08-30 DIAGNOSIS — R7989 Other specified abnormal findings of blood chemistry: Secondary | ICD-10-CM | POA: Diagnosis not present

## 2020-08-30 DIAGNOSIS — E1136 Type 2 diabetes mellitus with diabetic cataract: Secondary | ICD-10-CM | POA: Diagnosis not present

## 2020-08-30 DIAGNOSIS — Z7982 Long term (current) use of aspirin: Secondary | ICD-10-CM | POA: Diagnosis not present

## 2020-08-30 LAB — CBC WITH DIFFERENTIAL/PLATELET
Abs Immature Granulocytes: 0.01 10*3/uL (ref 0.00–0.07)
Basophils Absolute: 0 10*3/uL (ref 0.0–0.1)
Basophils Relative: 1 %
Eosinophils Absolute: 0.1 10*3/uL (ref 0.0–0.5)
Eosinophils Relative: 2 %
HCT: 33.8 % — ABNORMAL LOW (ref 36.0–46.0)
Hemoglobin: 10.9 g/dL — ABNORMAL LOW (ref 12.0–15.0)
Immature Granulocytes: 0 %
Lymphocytes Relative: 36 %
Lymphs Abs: 1.6 10*3/uL (ref 0.7–4.0)
MCH: 25.5 pg — ABNORMAL LOW (ref 26.0–34.0)
MCHC: 32.2 g/dL (ref 30.0–36.0)
MCV: 79 fL — ABNORMAL LOW (ref 80.0–100.0)
Monocytes Absolute: 0.3 10*3/uL (ref 0.1–1.0)
Monocytes Relative: 7 %
Neutro Abs: 2.5 10*3/uL (ref 1.7–7.7)
Neutrophils Relative %: 54 %
Platelets: 240 10*3/uL (ref 150–400)
RBC: 4.28 MIL/uL (ref 3.87–5.11)
RDW: 15.9 % — ABNORMAL HIGH (ref 11.5–15.5)
WBC: 4.6 10*3/uL (ref 4.0–10.5)
nRBC: 0 % (ref 0.0–0.2)

## 2020-08-30 NOTE — Progress Notes (Signed)
[facility] NOTE  Patient Care Team: [name], MD as PCP - General (Family Medicine)  CHIEF COMPLAINTS/PURPOSE OF CONSULTATION:  Abnormal labs.  List of  ASSESSMENT & PLAN:  No problem-specific Assessment & Plan notes found for this encounter.  No orders of the defined types were placed in this encounter.  This is a very pleasant 76 year old female patient with past medical history significant for type 2 diabetes mellitus, hypertension, dyslipidemia, bilateral cataracts referred to hematology for evaluation of abnormal labs. We have reviewed labs which showed no evidence of monoclonal protein, mildly elevated kappa lambda ratio at 2.2, no elevated total protein, normal creatinine, no elevated calcium or alkaline phosphatase. Kappa lambda ratio up to 3.1 can be related to inflammation, hence I have recommended labs today for further evaluation, and she is here to FU on additional lab results. She is here for FU. She is doing well today except for some intermittent headaches, fatigue and peripheral neuropathy PE unremarkable, no lymphadenopathy or hepatosplenomegaly. I have reviewed labs which showed mildly elevated K/L ratio at 2.3, otherwise no monoclonal protein on SPEP, CMP normal. No hemolysis, no nutritional deficiency. Her anemia has significantly improved. UPEP pending If no evidence of monoclonal protein on urine, we will follow her in a yr with repeat labs since K/L ratio of up to 3.1 could be from benign reasons.  Thank you for consulting [place] in the care of this patient.  Please not hesitate to contact us with any additional questions or concerns.  HISTORY OF PRESENTING ILLNESS:   Donna Wu 74 y.o. female is here because of abnormal labs  This is a very pleasant 75 year old female patient with past medical history significant for type 2 diabetes, hypertension, dyslipidemia, bilateral cataract referred to hematology for evaluation of abnormal  labs which is primarily elevated kappa lambda ratio.   Interim History She is doing well, reports some intermittent fatigue, headaches and baseline peripheral neuropathy No new complaints, NO B symptoms. No change in breathing, bowel or urinary habits. No new bone pains. Rest of the pertinent 10 point ROS reviewed and unremarkable.  REVIEW OF SYSTEMS:   Constitutional: Denies fevers, chills or abnormal night sweats Eyes: Denies blurriness of vision, double vision or watery eyes Ears, nose, mouth, throat, and face: Denies mucositis or sore throat Respiratory: Denies cough, dyspnea or wheezes Cardiovascular: Denies palpitation, chest discomfort or lower extremity swelling Gastrointestinal:  Denies nausea, heartburn or change in bowel habits Skin: Denies abnormal skin rashes Lymphatics: Denies new lymphadenopathy or easy bruising Neurological:Denies numbness, tingling or new weaknesses Behavioral/Psych: Mood is stable, no new changes  All other systems were reviewed with the patient and are negative.  MEDICAL HISTORY:  Past Medical History:  Diagnosis Date  . Anxiety   . Arthritis   . Bronchitis    hx of  . Cataracts, bilateral   . Colon polyps   . Diabetes mellitus without complication ([place])    on oral medicine  . GERD (gastroesophageal reflux disease)   . H/O hiatal hernia   . Hyperlipidemia   . Hypertension    Dr. [name]   . Pneumonia    "as a baby"    SURGICAL HISTORY: Past Surgical History:  Procedure Laterality Date  . ABDOMINAL HYSTERECTOMY    . BREAST BIOPSY Right    No Scar visible   . BREAST LUMPECTOMY  app. 40 years ago  . BREAST SURGERY    . CARDIOVASCULAR STRESS TEST     done approx. 7  years ago  . ESOPHAGOGASTRODUODENOSCOPY ENDOSCOPY    . TOTAL KNEE ARTHROPLASTY  03/31/2012   Procedure: TOTAL KNEE ARTHROPLASTY;  Surgeon: Meredith Pel, MD;  Location: [facility];  Service: Orthopedics;  Laterality: Left;  Left total knee arthroplasty  . TUBAL  LIGATION      SOCIAL HISTORY: Social History   Socioeconomic History  . Marital status: Legally Separated    Spouse name: Not on file  . Number of children: 4  . Years of education: Not on file  . Highest education level: Not on file  Occupational History  . Not on file  Tobacco Use  . Smoking status: Never Smoker  . Smokeless tobacco: Never Used  Substance and Sexual Activity  . Alcohol use: No  . Drug use: No  . Sexual activity: Not on file  Other Topics Concern  . Not on file  Social History Narrative  . Not on file   Social Determinants of Health   Financial Resource Strain: Not on file  Food Insecurity: Not on file  Transportation Needs: Not on file  Physical Activity: Not on file  Stress: Not on file  Social Connections: Not on file  Intimate Partner Violence: Not on file    FAMILY HISTORY: Family History  Problem Relation Age of Onset  . Hypertension Mother   . Heart attack Mother 28  . Dementia Father   . Hypertension Brother   . Hypertension Brother   . Lung cancer Brother   . Hypertension Sister   . Hypertension Sister   . Breast cancer Neg Hx     ALLERGIES:  is allergic to metformin and related.  MEDICATIONS:  Current Outpatient Medications  Medication Sig Dispense Refill  . aspirin 81 MG chewable tablet 1 tablet    . atorvastatin (LIPITOR) 20 MG tablet Take 1 tablet (20 mg total) by mouth daily at 6 PM.    . benazepril (LOTENSIN) 10 MG tablet Take 0.5 tablets (5 mg total) by mouth daily. (Patient taking differently: Take 10 mg by mouth daily.) 30 tablet 2  . Cholecalciferol (VITAMIN D3) 5000 UNITS CAPS Take 1 capsule by mouth daily.    . diclofenac Sodium (VOLTAREN) 1 % GEL See admin instructions.    . hydrochlorothiazide (HYDRODIURIL) 25 MG tablet Take 1 tablet (25 mg total) by mouth daily. 30 tablet 2  . Iron-FA-B Cmp-C-Biot-Probiotic (FUSION PLUS) CAPS Take 1 capsule by mouth daily.    Marland Kitchen LORazepam (ATIVAN) 0.5 MG tablet Take 0.5 mg by  mouth 3 (three) times daily as needed. For anxiety    . metoprolol succinate (TOPROL-XL) 25 MG 24 hr tablet Take 25 mg by mouth daily.    Marland Kitchen omeprazole (PRILOSEC) 40 MG capsule Take 40 mg by mouth daily.    . sertraline (ZOLOFT) 50 MG tablet Take 50 mg by mouth daily.    . Vitamin D, Ergocalciferol, (DRISDOL) 1.25 MG (50000 UNIT) CAPS capsule Take 50,000 Units by mouth once a week.     No current facility-administered medications for this visit.     PHYSICAL EXAMINATION: ECOG PERFORMANCE STATUS: 0 - Asymptomatic  Vitals:   08/30/20 0917  BP: (!) 143/63  Pulse: 87  Resp: 20  Temp: (!) 97.5 F (36.4 C)  SpO2: 97%   Filed Weights   08/30/20 0917  Weight: 229 lb 8 oz (104.1 kg)    GENERAL:alert, no distress and comfortable SKIN: skin color, texture, turgor are normal, no rashes or significant lesions EYES: normal, conjunctiva are pink and non-injected, sclera  clear OROPHARYNX:no exudate, no erythema and lips, buccal mucosa, and tongue normal  NECK: supple, thyroid normal size, non-tender, without nodularity LYMPH:  no palpable lymphadenopathy in the cervical, axillary or inguinal LUNGS: clear to auscultation and percussion with normal breathing effort HEART: regular rate & rhythm and no murmurs and no lower extremity edema ABDOMEN:abdomen soft, non-tender and normal bowel sounds Musculoskeletal:no cyanosis of digits and no clubbing  PSYCH: alert & oriented x 3 with fluent speech NEURO: no focal motor/sensory deficits  LABORATORY DATA:  I have reviewed the data as listed Lab Results  Component Value Date   WBC 5.3 08/15/2020   HGB 11.6 (L) 08/15/2020   HCT 36.3 08/15/2020   MCV 79.4 (L) 08/15/2020   PLT 235 08/15/2020     Chemistry      Component Value Date/Time   NA 138 08/15/2020 1107   K 3.7 08/15/2020 1107   CL 100 08/15/2020 1107   CO2 26 08/15/2020 1107   BUN 22 08/15/2020 1107   CREATININE 0.90 08/15/2020 1107      Component Value Date/Time   CALCIUM 9.5  08/15/2020 1107   ALKPHOS 99 08/15/2020 1107   AST 15 08/15/2020 1107   ALT 11 08/15/2020 1107   BILITOT 0.5 08/15/2020 1107     Reviewed labs.  RADIOGRAPHIC STUDIES: I have personally reviewed the radiological images as listed and agreed with the findings in the report. No results found.  All questions were answered. The patient knows to call the clinic with any problems, questions or concerns. I spent 20 minutes in the care of this patient including H and P, review of records, counseling and coordination of care.     Benay Pike, MD 08/30/2020 9:33 AM

## 2020-09-04 ENCOUNTER — Other Ambulatory Visit: Payer: Self-pay | Admitting: *Deleted

## 2020-09-04 DIAGNOSIS — I10 Essential (primary) hypertension: Secondary | ICD-10-CM | POA: Diagnosis not present

## 2020-09-04 DIAGNOSIS — K219 Gastro-esophageal reflux disease without esophagitis: Secondary | ICD-10-CM | POA: Diagnosis not present

## 2020-09-04 DIAGNOSIS — R7989 Other specified abnormal findings of blood chemistry: Secondary | ICD-10-CM | POA: Diagnosis not present

## 2020-09-04 DIAGNOSIS — Z7982 Long term (current) use of aspirin: Secondary | ICD-10-CM | POA: Diagnosis not present

## 2020-09-04 DIAGNOSIS — M1990 Unspecified osteoarthritis, unspecified site: Secondary | ICD-10-CM | POA: Diagnosis not present

## 2020-09-04 DIAGNOSIS — Z79899 Other long term (current) drug therapy: Secondary | ICD-10-CM | POA: Diagnosis not present

## 2020-09-04 DIAGNOSIS — D649 Anemia, unspecified: Secondary | ICD-10-CM

## 2020-09-04 DIAGNOSIS — E1140 Type 2 diabetes mellitus with diabetic neuropathy, unspecified: Secondary | ICD-10-CM | POA: Diagnosis not present

## 2020-09-04 DIAGNOSIS — E1136 Type 2 diabetes mellitus with diabetic cataract: Secondary | ICD-10-CM | POA: Diagnosis not present

## 2020-09-04 DIAGNOSIS — E785 Hyperlipidemia, unspecified: Secondary | ICD-10-CM | POA: Diagnosis not present

## 2020-09-05 LAB — UPEP/TP, 24-HR URINE
Albumin, U: 100 %
Alpha 1, Urine: 0 %
Alpha 2, Urine: 0 %
Beta, Urine: 0 %
Gamma Globulin, Urine: 0 %
Total Protein, Urine-Ur/day: 67 mg/24 hr (ref 30–150)
Total Protein, Urine: 6.7 mg/dL
Total Volume: 1000

## 2020-09-21 DIAGNOSIS — D649 Anemia, unspecified: Secondary | ICD-10-CM | POA: Diagnosis not present

## 2020-11-01 DIAGNOSIS — E559 Vitamin D deficiency, unspecified: Secondary | ICD-10-CM | POA: Diagnosis not present

## 2021-02-02 DIAGNOSIS — Z23 Encounter for immunization: Secondary | ICD-10-CM | POA: Diagnosis not present

## 2021-03-14 ENCOUNTER — Other Ambulatory Visit: Payer: Self-pay | Admitting: Family Medicine

## 2021-03-14 DIAGNOSIS — Z1231 Encounter for screening mammogram for malignant neoplasm of breast: Secondary | ICD-10-CM

## 2021-04-23 ENCOUNTER — Inpatient Hospital Stay: Admission: RE | Admit: 2021-04-23 | Payer: Medicare Other | Source: Ambulatory Visit

## 2021-04-23 ENCOUNTER — Other Ambulatory Visit: Payer: Self-pay

## 2021-04-23 ENCOUNTER — Ambulatory Visit
Admission: RE | Admit: 2021-04-23 | Discharge: 2021-04-23 | Disposition: A | Discharge status: Home / Self Care | Payer: Medicare Other | Source: Ambulatory Visit | Attending: Family Medicine | Admitting: Family Medicine

## 2021-04-23 DIAGNOSIS — Z1231 Encounter for screening mammogram for malignant neoplasm of breast: Secondary | ICD-10-CM | POA: Diagnosis not present

## 2021-05-10 DIAGNOSIS — D122 Benign neoplasm of ascending colon: Secondary | ICD-10-CM | POA: Diagnosis not present

## 2021-05-10 DIAGNOSIS — K5730 Diverticulosis of large intestine without perforation or abscess without bleeding: Secondary | ICD-10-CM | POA: Diagnosis not present

## 2021-05-10 DIAGNOSIS — Z86010 Personal history of colonic polyps: Secondary | ICD-10-CM | POA: Diagnosis not present

## 2021-05-15 DIAGNOSIS — D122 Benign neoplasm of ascending colon: Secondary | ICD-10-CM | POA: Diagnosis not present

## 2021-05-18 DIAGNOSIS — M1711 Unilateral primary osteoarthritis, right knee: Secondary | ICD-10-CM | POA: Diagnosis not present

## 2021-05-18 DIAGNOSIS — M1990 Unspecified osteoarthritis, unspecified site: Secondary | ICD-10-CM | POA: Diagnosis not present

## 2021-05-18 DIAGNOSIS — E1122 Type 2 diabetes mellitus with diabetic chronic kidney disease: Secondary | ICD-10-CM | POA: Diagnosis not present

## 2021-05-18 DIAGNOSIS — E785 Hyperlipidemia, unspecified: Secondary | ICD-10-CM | POA: Diagnosis not present

## 2021-05-18 DIAGNOSIS — D509 Iron deficiency anemia, unspecified: Secondary | ICD-10-CM | POA: Diagnosis not present

## 2021-05-18 DIAGNOSIS — I10 Essential (primary) hypertension: Secondary | ICD-10-CM | POA: Diagnosis not present

## 2021-05-18 DIAGNOSIS — N182 Chronic kidney disease, stage 2 (mild): Secondary | ICD-10-CM | POA: Diagnosis not present

## 2021-05-18 DIAGNOSIS — K219 Gastro-esophageal reflux disease without esophagitis: Secondary | ICD-10-CM | POA: Diagnosis not present

## 2021-06-25 ENCOUNTER — Ambulatory Visit: Payer: Self-pay

## 2021-06-25 ENCOUNTER — Other Ambulatory Visit: Payer: Self-pay

## 2021-06-25 ENCOUNTER — Ambulatory Visit (INDEPENDENT_AMBULATORY_CARE_PROVIDER_SITE_OTHER): Payer: Medicare Other | Admitting: Orthopedic Surgery

## 2021-06-25 DIAGNOSIS — M1711 Unilateral primary osteoarthritis, right knee: Secondary | ICD-10-CM

## 2021-06-25 DIAGNOSIS — M545 Low back pain, unspecified: Secondary | ICD-10-CM

## 2021-06-25 DIAGNOSIS — M25551 Pain in right hip: Secondary | ICD-10-CM | POA: Diagnosis not present

## 2021-06-25 DIAGNOSIS — M79604 Pain in right leg: Secondary | ICD-10-CM | POA: Diagnosis not present

## 2021-06-26 ENCOUNTER — Encounter: Payer: Self-pay | Admitting: Orthopedic Surgery

## 2021-06-26 MED ORDER — LIDOCAINE HCL 1 % IJ SOLN
5.0000 mL | INTRAMUSCULAR | Status: AC | PRN
  Administered 2021-06-25: 5 mL

## 2021-06-26 MED ORDER — BUPIVACAINE HCL 0.25 % IJ SOLN
4.0000 mL | INTRAMUSCULAR | Status: AC | PRN
  Administered 2021-06-25: 4 mL via INTRA_ARTICULAR

## 2021-06-26 MED ORDER — METHYLPREDNISOLONE ACETATE 40 MG/ML IJ SUSP
40.0000 mg | INTRAMUSCULAR | Status: AC | PRN
  Administered 2021-06-25: 40 mg via INTRA_ARTICULAR

## 2021-06-26 NOTE — Progress Notes (Signed)
Office Visit Note   Patient: Donna Wu           Date of Birth: 01/25/1946           MRN: [id number] Visit Date: 06/25/2021 Requested by: [name], MD [address],  [place] [address] PCP: [name], MD  Subjective: Chief Complaint  Patient presents with   Right Leg - Pain    HPI: [name] is a patient with 1 month history of right hip pain and right knee pain.  She has had left knee replacement and is doing well with that.  At times she has been unable to walk.  She does report some low back pain as well as groin pain and buttock pain.  Denies any fevers or chills.  She reports knee pain as well as hip pain.  She is taking some over-the-counter medications without much relief.  No acute injury to cause the symptoms.              ROS: All systems reviewed are negative as they relate to the chief complaint within the history of present illness.  Patient denies  fevers or chills.   Assessment & Plan: Visit Diagnoses:  1. 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  I suspect that the right hip is her more symptomatic arthritic joint.  Plan today is diagnostic and therapeutic right knee injection followed by diagnostic and therapeutic right hip injection next week with Dr. Ernestina Patches.  8-week return so we can decide which is the most symptomatic joint.  It is possible there may be a component of radiculopathy to this as well but no symptoms are really going below the knee.  Follow-up in 8 weeks.  Follow-Up Instructions: No follow-ups on file.   Orders:  Orders Placed This Encounter  Procedures   XR Knee 1-2 Views Right   XR HIP UNILAT W OR W/O PELVIS 2-3 VIEWS RIGHT   XR Lumbar Spine 2-3 Views   No orders of the defined types were placed in this encounter.     Procedures: Large Joint Inj: R knee on 06/25/2021 10:43 PM Indications: diagnostic evaluation, joint swelling and pain Details: 18 G 1.5  in needle, superolateral approach  Arthrogram: No  Medications: 5 mL lidocaine 1 %; 40 mg methylPREDNISolone acetate 40 MG/ML; 4 mL bupivacaine 0.25 % Outcome: tolerated well, no immediate complications Procedure, treatment alternatives, risks and benefits explained, specific risks discussed. Consent was given by the patient. Immediately prior to procedure a time out was called to verify the correct patient, procedure, equipment, support staff and site/side marked as required. Patient was prepped and draped in the usual sterile fashion.      Clinical Data: No additional findings.  Objective: Vital Signs: There were no vitals taken for this visit.  Physical Exam:   Constitutional: Patient appears well-developed HEENT:  Head: Normocephalic Eyes:EOM are normal Neck: Normal range of motion Cardiovascular: Normal rate Pulmonary/chest: Effort normal Neurologic: Patient is alert Skin: Skin is warm Psychiatric: Patient has normal mood and affect   Ortho Exam: Ortho exam demonstrates slight antalgic gait to the right which is Trendelenburg type gait.  Left knee has excellent range of motion and alignment.  Right knee has no effusion and flexion to about 110.  Collateral crucial ligaments are stable.  She does have mild groin pain on the right with internal and external rotation of the of the hip.  Pedal pulses palpable.  Ankle dorsiflexion intact.  No nerve root tension signs.  No trochanteric tenderness is present.  Specialty Comments:  No specialty comments available.  Imaging: No results found.   PMFS History: Patient Active Problem List   Diagnosis Date Noted   Essential hypertension 05/28/2014   Hyperlipidemia 05/28/2014   Diabetes mellitus type 2, controlled ([place]) 05/28/2014   S/P colonoscopic polypectomy    Lower GI bleed 05/27/2014   Past Medical History:  Diagnosis Date   Anxiety    Arthritis    Bronchitis    hx of   Cataracts, bilateral    Colon polyps     Diabetes mellitus without complication ([facility])    on oral medicine   GERD (gastroesophageal reflux disease)    H/O hiatal hernia    Hyperlipidemia    Hypertension    Dr. [name]    Pneumonia    "as a baby"    Family History  Problem Relation Age of Onset   Hypertension Mother    Heart attack Mother 43   Dementia Father    Hypertension Brother    Hypertension Brother    Lung cancer Brother    Hypertension Sister    Hypertension Sister    Breast cancer Neg Hx     Past Surgical History:  Procedure Laterality Date   ABDOMINAL HYSTERECTOMY     BREAST BIOPSY Right    No Scar visible    BREAST LUMPECTOMY  app. 40 years ago   BREAST SURGERY     CARDIOVASCULAR STRESS TEST     done approx. 7 years ago   ESOPHAGOGASTRODUODENOSCOPY ENDOSCOPY     TOTAL KNEE ARTHROPLASTY  03/31/2012   Procedure: TOTAL KNEE ARTHROPLASTY;  Surgeon: Meredith Pel, MD;  Location: [facility];  Service: Orthopedics;  Laterality: Left;  Left total knee arthroplasty   TUBAL LIGATION     Social History   Occupational History   Not on file  Tobacco Use   Smoking status: Never   Smokeless tobacco: Never  Substance and Sexual Activity   Alcohol use: No   Drug use: No   Sexual activity: Not on file

## 2021-06-29 ENCOUNTER — Encounter (HOSPITAL_COMMUNITY): Payer: Self-pay | Admitting: Emergency Medicine

## 2021-06-29 ENCOUNTER — Emergency Department (HOSPITAL_COMMUNITY): Payer: Medicare Other

## 2021-06-29 ENCOUNTER — Other Ambulatory Visit: Payer: Self-pay

## 2021-06-29 ENCOUNTER — Observation Stay (HOSPITAL_COMMUNITY)
Admission: EM | Admit: 2021-06-29 | Discharge: 2021-06-30 | Disposition: A | Discharge status: Home / Self Care | Payer: Medicare Other | Attending: Internal Medicine | Admitting: Internal Medicine

## 2021-06-29 DIAGNOSIS — Z79899 Other long term (current) drug therapy: Secondary | ICD-10-CM | POA: Insufficient documentation

## 2021-06-29 DIAGNOSIS — Z7982 Long term (current) use of aspirin: Secondary | ICD-10-CM | POA: Diagnosis not present

## 2021-06-29 DIAGNOSIS — R55 Syncope and collapse: Principal | ICD-10-CM | POA: Diagnosis present

## 2021-06-29 DIAGNOSIS — K449 Diaphragmatic hernia without obstruction or gangrene: Secondary | ICD-10-CM | POA: Diagnosis not present

## 2021-06-29 DIAGNOSIS — R911 Solitary pulmonary nodule: Secondary | ICD-10-CM | POA: Diagnosis not present

## 2021-06-29 DIAGNOSIS — Z20822 Contact with and (suspected) exposure to covid-19: Secondary | ICD-10-CM | POA: Diagnosis not present

## 2021-06-29 DIAGNOSIS — S0990XA Unspecified injury of head, initial encounter: Secondary | ICD-10-CM | POA: Diagnosis not present

## 2021-06-29 DIAGNOSIS — I700 Atherosclerosis of aorta: Secondary | ICD-10-CM | POA: Diagnosis not present

## 2021-06-29 DIAGNOSIS — M1611 Unilateral primary osteoarthritis, right hip: Secondary | ICD-10-CM | POA: Diagnosis not present

## 2021-06-29 DIAGNOSIS — R918 Other nonspecific abnormal finding of lung field: Secondary | ICD-10-CM | POA: Insufficient documentation

## 2021-06-29 DIAGNOSIS — E876 Hypokalemia: Secondary | ICD-10-CM | POA: Diagnosis present

## 2021-06-29 DIAGNOSIS — N19 Unspecified kidney failure: Secondary | ICD-10-CM

## 2021-06-29 DIAGNOSIS — R072 Precordial pain: Secondary | ICD-10-CM | POA: Diagnosis not present

## 2021-06-29 DIAGNOSIS — I10 Essential (primary) hypertension: Secondary | ICD-10-CM | POA: Diagnosis present

## 2021-06-29 DIAGNOSIS — E860 Dehydration: Secondary | ICD-10-CM | POA: Diagnosis not present

## 2021-06-29 DIAGNOSIS — Z0189 Encounter for other specified special examinations: Secondary | ICD-10-CM

## 2021-06-29 DIAGNOSIS — M2578 Osteophyte, vertebrae: Secondary | ICD-10-CM | POA: Diagnosis not present

## 2021-06-29 DIAGNOSIS — T1490XA Injury, unspecified, initial encounter: Secondary | ICD-10-CM | POA: Diagnosis not present

## 2021-06-29 DIAGNOSIS — Z96652 Presence of left artificial knee joint: Secondary | ICD-10-CM | POA: Insufficient documentation

## 2021-06-29 DIAGNOSIS — E785 Hyperlipidemia, unspecified: Secondary | ICD-10-CM | POA: Diagnosis not present

## 2021-06-29 DIAGNOSIS — E119 Type 2 diabetes mellitus without complications: Secondary | ICD-10-CM | POA: Diagnosis not present

## 2021-06-29 LAB — URINALYSIS, ROUTINE W REFLEX MICROSCOPIC
Bilirubin Urine: NEGATIVE
Glucose, UA: NEGATIVE mg/dL
Hgb urine dipstick: NEGATIVE
Ketones, ur: 5 mg/dL — AB
Leukocytes,Ua: NEGATIVE
Nitrite: NEGATIVE
Protein, ur: NEGATIVE mg/dL
Specific Gravity, Urine: >1.046 — ABNORMAL HIGH (ref 1.005–1.030)
pH: 6 (ref 5.0–8.0)

## 2021-06-29 LAB — CBC WITH DIFFERENTIAL/PLATELET
Abs Immature Granulocytes: 0.02 10*3/uL (ref 0.00–0.07)
Basophils Absolute: 0 10*3/uL (ref 0.0–0.1)
Basophils Relative: 0 %
Eosinophils Absolute: 0.1 10*3/uL (ref 0.0–0.5)
Eosinophils Relative: 2 %
HCT: 34.5 % — ABNORMAL LOW (ref 36.0–46.0)
Hemoglobin: 11 g/dL — ABNORMAL LOW (ref 12.0–15.0)
Immature Granulocytes: 0 %
Lymphocytes Relative: 29 %
Lymphs Abs: 2.2 10*3/uL (ref 0.7–4.0)
MCH: 25.2 pg — ABNORMAL LOW (ref 26.0–34.0)
MCHC: 31.9 g/dL (ref 30.0–36.0)
MCV: 79.1 fL — ABNORMAL LOW (ref 80.0–100.0)
Monocytes Absolute: 0.6 10*3/uL (ref 0.1–1.0)
Monocytes Relative: 7 %
Neutro Abs: 4.7 10*3/uL (ref 1.7–7.7)
Neutrophils Relative %: 62 %
Platelets: 230 10*3/uL (ref 150–400)
RBC: 4.36 MIL/uL (ref 3.87–5.11)
RDW: 15.5 % (ref 11.5–15.5)
WBC: 7.6 10*3/uL (ref 4.0–10.5)
nRBC: 0 % (ref 0.0–0.2)

## 2021-06-29 LAB — BASIC METABOLIC PANEL
Anion gap: 10 (ref 5–15)
BUN: 16 mg/dL (ref 8–23)
CO2: 27 mmol/L (ref 22–32)
Calcium: 9.3 mg/dL (ref 8.9–10.3)
Chloride: 101 mmol/L (ref 98–111)
Creatinine, Ser: 0.7 mg/dL (ref 0.44–1.00)
GFR, Estimated: >60 mL/min (ref >60)
Glucose, Bld: 119 mg/dL — ABNORMAL HIGH (ref 70–99)
Potassium: 3 mmol/L — ABNORMAL LOW (ref 3.5–5.1)
Sodium: 138 mmol/L (ref 135–145)

## 2021-06-29 LAB — TROPONIN I (HIGH SENSITIVITY)
Troponin I (High Sensitivity): 3 ng/L (ref <18)
Troponin I (High Sensitivity): 2 ng/L (ref <18)

## 2021-06-29 LAB — RESP PANEL BY RT-PCR (FLU A&B, COVID) ARPGX2
Influenza A by PCR: NEGATIVE
Influenza B by PCR: NEGATIVE
SARS Coronavirus 2 by RT PCR: NEGATIVE

## 2021-06-29 LAB — MAGNESIUM: Magnesium: 1.9 mg/dL (ref 1.7–2.4)

## 2021-06-29 MED ORDER — ACETAMINOPHEN 325 MG PO TABS: 650.0000 mg | ORAL_TABLET | Freq: Four times a day (QID) | ORAL | Status: DC | PRN

## 2021-06-29 MED ORDER — ACETAMINOPHEN 650 MG RE SUPP: 650.0000 mg | Freq: Four times a day (QID) | RECTAL | Status: DC | PRN

## 2021-06-29 MED ORDER — POTASSIUM CHLORIDE CRYS ER 20 MEQ PO TBCR
40.0000 meq | EXTENDED_RELEASE_TABLET | Freq: Once | ORAL | Status: AC
  Administered 2021-06-29: 40 meq via ORAL
  Filled 2021-06-29: qty 2

## 2021-06-29 MED ORDER — IOHEXOL 300 MG/ML  SOLN
100.0000 mL | Freq: Once | INTRAMUSCULAR | Status: AC | PRN
  Administered 2021-06-29: 75 mL via INTRAVENOUS
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
## 2021-06-30 ENCOUNTER — Encounter (HOSPITAL_COMMUNITY): Payer: Self-pay | Admitting: Internal Medicine

## 2021-06-30 ENCOUNTER — Observation Stay (HOSPITAL_BASED_OUTPATIENT_CLINIC_OR_DEPARTMENT_OTHER): Payer: Medicare Other

## 2021-06-30 DIAGNOSIS — I10 Essential (primary) hypertension: Secondary | ICD-10-CM | POA: Diagnosis not present

## 2021-06-30 DIAGNOSIS — R55 Syncope and collapse: Secondary | ICD-10-CM

## 2021-06-30 DIAGNOSIS — E785 Hyperlipidemia, unspecified: Secondary | ICD-10-CM | POA: Diagnosis not present

## 2021-06-30 DIAGNOSIS — E876 Hypokalemia: Secondary | ICD-10-CM | POA: Diagnosis present

## 2021-06-30 DIAGNOSIS — E860 Dehydration: Secondary | ICD-10-CM | POA: Diagnosis not present

## 2021-06-30 DIAGNOSIS — N19 Unspecified kidney failure: Secondary | ICD-10-CM | POA: Diagnosis not present

## 2021-06-30 LAB — COMPREHENSIVE METABOLIC PANEL
ALT: 10 U/L (ref 0–44)
AST: 13 U/L — ABNORMAL LOW (ref 15–41)
Albumin: 3.6 g/dL (ref 3.5–5.0)
Alkaline Phosphatase: 74 U/L (ref 38–126)
Anion gap: 5 (ref 5–15)
BUN: 12 mg/dL (ref 8–23)
CO2: 26 mmol/L (ref 22–32)
Calcium: 8.9 mg/dL (ref 8.9–10.3)
Chloride: 106 mmol/L (ref 98–111)
Creatinine, Ser: 0.6 mg/dL (ref 0.44–1.00)
GFR, Estimated: >60 mL/min (ref >60)
Glucose, Bld: 132 mg/dL — ABNORMAL HIGH (ref 70–99)
Potassium: 3.9 mmol/L (ref 3.5–5.1)
Sodium: 137 mmol/L (ref 135–145)
Total Bilirubin: 0.6 mg/dL (ref 0.3–1.2)
Total Protein: 6.4 g/dL — ABNORMAL LOW (ref 6.5–8.1)

## 2021-06-30 LAB — CBC WITH DIFFERENTIAL/PLATELET
Abs Immature Granulocytes: 0.01 10*3/uL (ref 0.00–0.07)
Basophils Absolute: 0 10*3/uL (ref 0.0–0.1)
Basophils Relative: 1 %
Eosinophils Absolute: 0.1 10*3/uL (ref 0.0–0.5)
Eosinophils Relative: 2 %
HCT: 31.6 % — ABNORMAL LOW (ref 36.0–46.0)
Hemoglobin: 10.1 g/dL — ABNORMAL LOW (ref 12.0–15.0)
Immature Granulocytes: 0 %
Lymphocytes Relative: 27 %
Lymphs Abs: 1.7 10*3/uL (ref 0.7–4.0)
MCH: 25.4 pg — ABNORMAL LOW (ref 26.0–34.0)
MCHC: 32 g/dL (ref 30.0–36.0)
MCV: 79.4 fL — ABNORMAL LOW (ref 80.0–100.0)
Monocytes Absolute: 0.5 10*3/uL (ref 0.1–1.0)
Monocytes Relative: 9 %
Neutro Abs: 3.8 10*3/uL (ref 1.7–7.7)
Neutrophils Relative %: 61 %
Platelets: 196 10*3/uL (ref 150–400)
RBC: 3.98 MIL/uL (ref 3.87–5.11)
RDW: 15.5 % (ref 11.5–15.5)
WBC: 6.2 10*3/uL (ref 4.0–10.5)
nRBC: 0 % (ref 0.0–0.2)

## 2021-06-30 LAB — ECHOCARDIOGRAM COMPLETE
Area-P 1/2: 4.06 cm2
Calc EF: 68.6 %
S' Lateral: 2.5 cm
Single Plane A2C EF: 69.2 %
Single Plane A4C EF: 66.7 %

## 2021-06-30 LAB — TROPONIN I (HIGH SENSITIVITY): Troponin I (High Sensitivity): 4 ng/L (ref <18)

## 2021-06-30 LAB — MAGNESIUM: Magnesium: 1.9 mg/dL (ref 1.7–2.4)

## 2021-06-30 MED ORDER — PANTOPRAZOLE SODIUM 40 MG PO TBEC
40.0000 mg | DELAYED_RELEASE_TABLET | Freq: Every day | ORAL | Status: DC
  Administered 2021-06-30: 40 mg via ORAL
  Filled 2021-06-30: qty 1

## 2021-06-30 MED ORDER — FERROUS SULFATE 325 (65 FE) MG PO TABS: 325.0000 mg | ORAL_TABLET | Freq: Two times a day (BID) | ORAL | Status: DC

## 2021-06-30 MED ORDER — BENAZEPRIL HCL 10 MG PO TABS
10.0000 mg | ORAL_TABLET | Freq: Every day | ORAL | Status: DC
  Administered 2021-06-30: 10 mg via ORAL
  Filled 2021-06-30: qty 1

## 2021-06-30 MED ORDER — POTASSIUM CHLORIDE 10 MEQ/100ML IV SOLN
10.0000 meq | INTRAVENOUS | Status: AC
  Administered 2021-06-30 (×3): 10 meq via INTRAVENOUS
  Filled 2021-06-30 (×3): qty 100

## 2021-06-30 MED ORDER — METOPROLOL SUCCINATE ER 50 MG PO TB24
25.0000 mg | ORAL_TABLET | Freq: Every day | ORAL | Status: DC
  Administered 2021-06-30: 25 mg via ORAL
  Filled 2021-06-30: qty 1

## 2021-06-30 MED ORDER — FERROUS SULFATE 325 (65 FE) MG PO TABS: 325.0000 mg | ORAL_TABLET | Freq: Two times a day (BID) | ORAL | 0 refills | Status: AC

## 2021-06-30 MED ORDER — ASPIRIN 81 MG PO CHEW
81.0000 mg | CHEWABLE_TABLET | Freq: Every day | ORAL | Status: DC
  Administered 2021-06-30: 81 mg via ORAL
  Filled 2021-06-30: qty 1

## 2021-06-30 MED ORDER — ATORVASTATIN CALCIUM 10 MG PO TABS: 20.0000 mg | ORAL_TABLET | Freq: Every day | ORAL | Status: DC

## 2021-06-30 NOTE — ED Notes (Signed)
Echo at bedside for exam

## 2021-06-30 NOTE — ED Notes (Signed)
Patient requesting to sit in lobby to wait on family
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
## 2021-06-30 NOTE — Progress Notes (Signed)
°  Echocardiogram 2D Echocardiogram has been performed.  Donna Wu 06/30/2021, 11:27 AM

## 2021-06-30 NOTE — Discharge Summary (Addendum)
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  Examination Guidelines: A complete evaluation includes B-mode imaging, spectral Doppler, color Doppler, and power Doppler as needed of all accessible portions of each vessel. Bilateral testing is considered an integral part of a complete examination. Limited examinations for reoccurring indications may be performed as noted.  Right Carotid Findings: +----------+--------+--------+--------+------------------+--------+             PSV cm/s EDV cm/s Stenosis Plaque Description Comments  +----------+--------+--------+--------+------------------+--------+  CCA Prox   105      13                                             +----------+--------+--------+--------+------------------+--------+  CCA Distal 71       17                                             +----------+--------+--------+--------+------------------+--------+  ICA Prox   102      27       1-39%    heterogenous                 +----------+--------+--------+--------+------------------+--------+  ICA Distal 100      27                                   tortuous  +----------+--------+--------+--------+------------------+--------+  ECA         69       12                                             +----------+--------+--------+--------+------------------+--------+ +----------+--------+-------+----------------+-------------------+             PSV cm/s EDV cms Describe         Arm Pressure (mmHG)  +----------+--------+-------+----------------+-------------------+  Subclavian 184              Multiphasic, WNL                      +----------+--------+-------+----------------+-------------------+ +---------+--------+--+--------+--+---------+  Vertebral PSV cm/s 76 EDV cm/s 18 Antegrade  +---------+--------+--+--------+--+---------+  Left Carotid Findings: +----------+--------+--------+--------+------------------+------------------+             PSV cm/s EDV cm/s Stenosis Plaque Description Comments            +----------+--------+--------+--------+------------------+------------------+  CCA Prox   104      16                                                       +----------+--------+--------+--------+------------------+------------------+  CCA Distal 62       13                                   intimal thickening  +----------+--------+--------+--------+------------------+------------------+  ICA Prox   62       13       1-39%    heterogenous                           +----------+--------+--------+--------+------------------+------------------+  ICA Distal 117      39                                   tortuous            +----------+--------+--------+--------+------------------+------------------+  ECA        61       7                                                        +----------+--------+--------+--------+------------------+------------------+ +----------+--------+--------+----------------+-------------------+             PSV cm/s EDV cm/s Describe         Arm Pressure (mmHG)  +----------+--------+--------+----------------+-------------------+  Subclavian 179  Multiphasic, WNL                       +----------+--------+--------+----------------+-------------------+ +---------+--------+---+--------+--+---------+  Vertebral PSV cm/s 122 EDV cm/s 28 Antegrade  +---------+--------+---+--------+--+---------+   Summary: Right Carotid: Velocities in the right ICA are consistent with a 1-39% stenosis. Left Carotid: Velocities in the left ICA are consistent with a 1-39% stenosis. Vertebrals:  Bilateral vertebral arteries demonstrate antegrade flow. Subclavians: Normal flow hemodynamics were seen in bilateral subclavian              arteries. *See table(s) above for measurements and observations.     Preliminary     Microbiology: Results for orders placed or performed during the hospital encounter of 06/29/21  Resp Panel by RT-PCR (Flu A&B, Covid) Nasopharyngeal Swab     Status: None   Collection Time: 06/29/21 11:00 PM   Specimen: Nasopharyngeal Swab; Nasopharyngeal(NP) swabs in vial transport medium  Result Value Ref Range Status   SARS Coronavirus 2 by RT PCR NEGATIVE NEGATIVE Final    Comment: (NOTE) SARS-CoV-2 target nucleic acids are NOT DETECTED.  The SARS-CoV-2 RNA is generally detectable in upper respiratory specimens during the acute phase of infection. The lowest concentration of SARS-CoV-2 viral copies this assay can detect is 138 copies/mL. A negative result does not preclude SARS-Cov-2 infection and should not be used as the sole basis for treatment or other patient management decisions. A negative result may occur with  improper specimen collection/handling, submission of specimen other than nasopharyngeal swab, presence of viral mutation(s) within the areas targeted by this assay, and inadequate number of viral copies(<138 copies/mL). A negative result must be combined with clinical observations, patient history, and epidemiological information. The expected result is Negative.  Fact Sheet for Patients:  EntrepreneurPulse.com.au  Fact Sheet for Healthcare  Providers:  IncredibleEmployment.be  This test is no t yet approved or cleared by the [place] FDA and  has been authorized for detection and/or diagnosis of SARS-CoV-2 by FDA under an Emergency Use Authorization (EUA). This EUA will remain  in effect (meaning this test can be used) for the duration of the COVID-19 declaration under Section 564(b)(1) of the Act, 21 U.S.C.section 360bbb-3(b)(1), unless the authorization is terminated  or revoked sooner.       Influenza A by PCR NEGATIVE NEGATIVE Final   Influenza B by PCR NEGATIVE NEGATIVE Final    Comment: (NOTE) The Xpert Xpress SARS-CoV-2/FLU/RSV plus assay is intended as an aid in the diagnosis of influenza from Nasopharyngeal swab specimens and should not be used as a sole basis for treatment. Nasal washings and aspirates are unacceptable for Xpert Xpress SARS-CoV-2/FLU/RSV testing.  Fact Sheet for Patients: EntrepreneurPulse.com.au  Fact Sheet for Healthcare Providers: IncredibleEmployment.be  This test is not yet approved or cleared by the [place] FDA and has been authorized for detection and/or diagnosis of SARS-CoV-2 by FDA under an Emergency Use Authorization (EUA). This EUA will remain in effect (meaning this test can be used) for the duration of the COVID-19 declaration under Section 564(b)(1) of the Act, 21 U.S.C. section 360bbb-3(b)(1), unless the authorization is terminated or revoked.  Performed at [facility], [place] [address] [place], [place] [address]     Labs: CBC: Recent Labs  Lab 06/29/21 1938 06/30/21 0500  WBC 7.6 6.2  NEUTROABS 4.7 3.8  HGB 11.0* 10.1*  HCT 34.5* 31.6*  MCV 79.1* 79.4*  PLT 230 154   Basic Metabolic Panel: Recent Labs  Lab 06/29/21 1938 06/29/21  2201 06/30/21 0500  NA 138  --  137  K 3.0*  --  3.9  CL 101  --  106  CO2 27  --  26  GLUCOSE 119*  --  132*  BUN 16  --  12   CREATININE 0.70  --  0.60  CALCIUM 9.3  --  8.9  MG  --  1.9 1.9   Liver Function Tests: Recent Labs  Lab 06/30/21 0500  AST 13*  ALT 10  ALKPHOS 74  BILITOT 0.6  PROT 6.4*  ALBUMIN 3.6   CBG: No results for input(s): GLUCAP in the last 168 hours.  Discharge time spent: greater than 30 minutes.  Signed: Berle Mull, MD [facility] 06/30/2021

## 2021-06-30 NOTE — ED Notes (Signed)
Water provided to patient. Denies other needs

## 2021-06-30 NOTE — ED Notes (Signed)
Pt ambulated to and from restroom without assistance and without complications.

## 2021-07-02 ENCOUNTER — Encounter: Payer: Self-pay | Admitting: *Deleted

## 2021-07-02 ENCOUNTER — Ambulatory Visit (INDEPENDENT_AMBULATORY_CARE_PROVIDER_SITE_OTHER): Payer: Medicare Other

## 2021-07-02 DIAGNOSIS — R55 Syncope and collapse: Secondary | ICD-10-CM

## 2021-07-02 NOTE — Progress Notes (Unsigned)
Enrolled for [employer] to mail a ZIO XT long term holter monitor to the patients address on file.  Letter with instructions mailed to patient. 

## 2021-07-05 DIAGNOSIS — R55 Syncope and collapse: Secondary | ICD-10-CM | POA: Diagnosis not present

## 2021-07-09 DIAGNOSIS — I10 Essential (primary) hypertension: Secondary | ICD-10-CM | POA: Diagnosis not present

## 2021-07-09 DIAGNOSIS — E1122 Type 2 diabetes mellitus with diabetic chronic kidney disease: Secondary | ICD-10-CM | POA: Diagnosis not present

## 2021-07-09 DIAGNOSIS — K219 Gastro-esophageal reflux disease without esophagitis: Secondary | ICD-10-CM | POA: Diagnosis not present

## 2021-07-09 DIAGNOSIS — N182 Chronic kidney disease, stage 2 (mild): Secondary | ICD-10-CM | POA: Diagnosis not present

## 2021-07-09 DIAGNOSIS — E785 Hyperlipidemia, unspecified: Secondary | ICD-10-CM | POA: Diagnosis not present

## 2021-07-11 ENCOUNTER — Encounter: Payer: Self-pay | Admitting: Physical Medicine and Rehabilitation

## 2021-07-11 ENCOUNTER — Ambulatory Visit: Payer: Self-pay

## 2021-07-11 ENCOUNTER — Other Ambulatory Visit: Payer: Self-pay

## 2021-07-11 ENCOUNTER — Ambulatory Visit (INDEPENDENT_AMBULATORY_CARE_PROVIDER_SITE_OTHER): Payer: Medicare Other | Admitting: Physical Medicine and Rehabilitation

## 2021-07-11 DIAGNOSIS — M25551 Pain in right hip: Secondary | ICD-10-CM | POA: Diagnosis not present

## 2021-07-11 NOTE — Progress Notes (Signed)
° °  Donna Wu - 76 y.o. female MRN [id number]  Date of birth: 1946/01/27  Office Visit Note: Visit Date: 07/11/2021 PCP: [name], MD Referred by: Meredith Pel, MD  Subjective: Chief Complaint  Patient presents with   Right Hip - Pain   HPI:  Donna Wu is a 76 y.o. female who comes in today at the request of Dr. Anderson Malta for planned Right anesthetic hip arthrogram with fluoroscopic guidance.  The patient has failed conservative care including home exercise, medications, time and activity modification.  This injection will be diagnostic and hopefully therapeutic.  Please see requesting physician notes for further details and justification.   ROS Otherwise per HPI.  Assessment & Plan: Visit Diagnoses:    ICD-10-CM   1. Pain in right hip  M25.551 Large Joint Inj: R hip joint    XR C-ARM NO REPORT      Plan: No additional findings.   Meds & Orders: No orders of the defined types were placed in this encounter.   Orders Placed This Encounter  Procedures   Large Joint Inj: R hip joint   XR C-ARM NO REPORT    Follow-up: Return for  Anderson Malta, MD.   Procedures: Large Joint Inj: R hip joint on 07/11/2021 1:02 PM Indications: diagnostic evaluation and pain Details: 22 G 3.5 in needle, fluoroscopy-guided anterior approach  Arthrogram: No  Medications: 4 mL bupivacaine 0.25 %; 60 mg triamcinolone acetonide 40 MG/ML Outcome: tolerated well, no immediate complications  There was excellent flow of contrast producing a partial arthrogram of the hip. The patient did have relief of symptoms during the anesthetic phase of the injection. Procedure, treatment alternatives, risks and benefits explained, specific risks discussed. Consent was given by the patient. Immediately prior to procedure a time out was called to verify the correct patient, procedure, equipment, support staff and site/side marked as required. Patient was prepped and draped in the usual  sterile fashion.         Clinical History: No specialty comments available.     Objective:  VS:  HT:     WT:    BMI:      BP:    HR: bpm   TEMP: ( )   RESP:  Physical Exam   Imaging: No results found.

## 2021-07-19 DIAGNOSIS — H2513 Age-related nuclear cataract, bilateral: Secondary | ICD-10-CM | POA: Diagnosis not present

## 2021-07-27 DIAGNOSIS — R55 Syncope and collapse: Secondary | ICD-10-CM | POA: Diagnosis not present

## 2021-07-29 MED ORDER — BUPIVACAINE HCL 0.25 % IJ SOLN
4.0000 mL | INTRAMUSCULAR | Status: AC | PRN
  Administered 2021-07-11: 4 mL via INTRA_ARTICULAR

## 2021-07-29 MED ORDER — TRIAMCINOLONE ACETONIDE 40 MG/ML IJ SUSP
60.0000 mg | INTRAMUSCULAR | Status: AC | PRN
  Administered 2021-07-11: 60 mg via INTRA_ARTICULAR

## 2021-07-30 DIAGNOSIS — K449 Diaphragmatic hernia without obstruction or gangrene: Secondary | ICD-10-CM | POA: Diagnosis not present

## 2021-07-30 DIAGNOSIS — I6529 Occlusion and stenosis of unspecified carotid artery: Secondary | ICD-10-CM | POA: Diagnosis not present

## 2021-07-30 DIAGNOSIS — Z87898 Personal history of other specified conditions: Secondary | ICD-10-CM | POA: Diagnosis not present

## 2021-07-30 DIAGNOSIS — I700 Atherosclerosis of aorta: Secondary | ICD-10-CM | POA: Diagnosis not present

## 2021-07-30 DIAGNOSIS — I471 Supraventricular tachycardia: Secondary | ICD-10-CM | POA: Diagnosis not present

## 2021-07-30 DIAGNOSIS — R911 Solitary pulmonary nodule: Secondary | ICD-10-CM | POA: Diagnosis not present

## 2021-08-01 ENCOUNTER — Other Ambulatory Visit: Payer: Self-pay | Admitting: Family Medicine

## 2021-08-01 DIAGNOSIS — E2839 Other primary ovarian failure: Secondary | ICD-10-CM

## 2021-08-06 ENCOUNTER — Ambulatory Visit: Payer: Medicare Other | Admitting: Orthopedic Surgery

## 2021-08-07 ENCOUNTER — Ambulatory Visit
Admission: RE | Admit: 2021-08-07 | Discharge: 2021-08-07 | Disposition: A | Discharge status: Home / Self Care | Payer: Medicare Other | Source: Ambulatory Visit | Attending: Family Medicine | Admitting: Family Medicine

## 2021-08-07 DIAGNOSIS — E2839 Other primary ovarian failure: Secondary | ICD-10-CM

## 2021-08-07 DIAGNOSIS — Z780 Asymptomatic menopausal state: Secondary | ICD-10-CM | POA: Diagnosis not present

## 2021-08-07 DIAGNOSIS — M85832 Other specified disorders of bone density and structure, left forearm: Secondary | ICD-10-CM | POA: Diagnosis not present

## 2021-08-22 DIAGNOSIS — E1169 Type 2 diabetes mellitus with other specified complication: Secondary | ICD-10-CM | POA: Diagnosis not present

## 2021-08-22 DIAGNOSIS — E1122 Type 2 diabetes mellitus with diabetic chronic kidney disease: Secondary | ICD-10-CM | POA: Diagnosis not present

## 2021-08-22 DIAGNOSIS — I10 Essential (primary) hypertension: Secondary | ICD-10-CM | POA: Diagnosis not present

## 2021-08-22 DIAGNOSIS — I700 Atherosclerosis of aorta: Secondary | ICD-10-CM | POA: Diagnosis not present

## 2021-08-22 DIAGNOSIS — D692 Other nonthrombocytopenic purpura: Secondary | ICD-10-CM | POA: Diagnosis not present

## 2021-08-22 DIAGNOSIS — D509 Iron deficiency anemia, unspecified: Secondary | ICD-10-CM | POA: Diagnosis not present

## 2021-08-22 DIAGNOSIS — K219 Gastro-esophageal reflux disease without esophagitis: Secondary | ICD-10-CM | POA: Diagnosis not present

## 2021-08-22 DIAGNOSIS — Z79899 Other long term (current) drug therapy: Secondary | ICD-10-CM | POA: Diagnosis not present

## 2021-08-22 DIAGNOSIS — I471 Supraventricular tachycardia: Secondary | ICD-10-CM | POA: Diagnosis not present

## 2021-08-22 DIAGNOSIS — Z0000 Encounter for general adult medical examination without abnormal findings: Secondary | ICD-10-CM | POA: Diagnosis not present

## 2021-08-22 DIAGNOSIS — I6529 Occlusion and stenosis of unspecified carotid artery: Secondary | ICD-10-CM | POA: Diagnosis not present

## 2021-08-23 DIAGNOSIS — I10 Essential (primary) hypertension: Secondary | ICD-10-CM | POA: Diagnosis not present

## 2021-08-23 DIAGNOSIS — E1122 Type 2 diabetes mellitus with diabetic chronic kidney disease: Secondary | ICD-10-CM | POA: Diagnosis not present

## 2021-08-29 ENCOUNTER — Other Ambulatory Visit: Payer: Self-pay | Admitting: *Deleted

## 2021-08-29 ENCOUNTER — Encounter: Payer: Self-pay | Admitting: Hematology and Oncology

## 2021-08-29 ENCOUNTER — Inpatient Hospital Stay: Payer: Medicare Other | Attending: Hematology and Oncology

## 2021-08-29 ENCOUNTER — Other Ambulatory Visit: Payer: Self-pay

## 2021-08-29 ENCOUNTER — Inpatient Hospital Stay (HOSPITAL_BASED_OUTPATIENT_CLINIC_OR_DEPARTMENT_OTHER): Payer: Medicare Other | Admitting: Hematology and Oncology

## 2021-08-29 VITALS — BP 135/68 | HR 77 | Temp 97.5°F | Resp 16 | Ht 63.0 in | Wt 178.6 lb

## 2021-08-29 DIAGNOSIS — R779 Abnormality of plasma protein, unspecified: Secondary | ICD-10-CM | POA: Insufficient documentation

## 2021-08-29 DIAGNOSIS — Z79899 Other long term (current) drug therapy: Secondary | ICD-10-CM | POA: Insufficient documentation

## 2021-08-29 DIAGNOSIS — E119 Type 2 diabetes mellitus without complications: Secondary | ICD-10-CM | POA: Diagnosis not present

## 2021-08-29 DIAGNOSIS — D649 Anemia, unspecified: Secondary | ICD-10-CM

## 2021-08-29 DIAGNOSIS — F32A Depression, unspecified: Secondary | ICD-10-CM | POA: Insufficient documentation

## 2021-08-29 DIAGNOSIS — I10 Essential (primary) hypertension: Secondary | ICD-10-CM | POA: Insufficient documentation

## 2021-08-29 DIAGNOSIS — Z90710 Acquired absence of both cervix and uterus: Secondary | ICD-10-CM | POA: Insufficient documentation

## 2021-08-29 DIAGNOSIS — F419 Anxiety disorder, unspecified: Secondary | ICD-10-CM | POA: Diagnosis not present

## 2021-08-29 DIAGNOSIS — K922 Gastrointestinal hemorrhage, unspecified: Secondary | ICD-10-CM

## 2021-08-29 DIAGNOSIS — E785 Hyperlipidemia, unspecified: Secondary | ICD-10-CM | POA: Diagnosis not present

## 2021-08-29 LAB — CMP (CANCER CENTER ONLY)
ALT: 8 U/L (ref 0–44)
AST: 10 U/L — ABNORMAL LOW (ref 15–41)
Albumin: 3.9 g/dL (ref 3.5–5.0)
Alkaline Phosphatase: 78 U/L (ref 38–126)
Anion gap: 6 (ref 5–15)
BUN: 15 mg/dL (ref 8–23)
CO2: 28 mmol/L (ref 22–32)
Calcium: 9.4 mg/dL (ref 8.9–10.3)
Chloride: 105 mmol/L (ref 98–111)
Creatinine: 0.79 mg/dL (ref 0.44–1.00)
GFR, Estimated: >60 mL/min (ref >60)
Glucose, Bld: 150 mg/dL — ABNORMAL HIGH (ref 70–99)
Potassium: 3.7 mmol/L (ref 3.5–5.1)
Sodium: 139 mmol/L (ref 135–145)
Total Bilirubin: 0.4 mg/dL (ref 0.3–1.2)
Total Protein: 7 g/dL (ref 6.5–8.1)

## 2021-08-29 LAB — CBC WITH DIFFERENTIAL (CANCER CENTER ONLY)
Abs Immature Granulocytes: 0.01 10*3/uL (ref 0.00–0.07)
Basophils Absolute: 0 10*3/uL (ref 0.0–0.1)
Basophils Relative: 0 %
Eosinophils Absolute: 0.1 10*3/uL (ref 0.0–0.5)
Eosinophils Relative: 3 %
HCT: 32.1 % — ABNORMAL LOW (ref 36.0–46.0)
Hemoglobin: 10.2 g/dL — ABNORMAL LOW (ref 12.0–15.0)
Immature Granulocytes: 0 %
Lymphocytes Relative: 32 %
Lymphs Abs: 1.5 10*3/uL (ref 0.7–4.0)
MCH: 24.9 pg — ABNORMAL LOW (ref 26.0–34.0)
MCHC: 31.8 g/dL (ref 30.0–36.0)
MCV: 78.3 fL — ABNORMAL LOW (ref 80.0–100.0)
Monocytes Absolute: 0.2 10*3/uL (ref 0.1–1.0)
Monocytes Relative: 5 %
Neutro Abs: 2.7 10*3/uL (ref 1.7–7.7)
Neutrophils Relative %: 60 %
Platelet Count: 244 10*3/uL (ref 150–400)
RBC: 4.1 MIL/uL (ref 3.87–5.11)
RDW: 15.8 % — ABNORMAL HIGH (ref 11.5–15.5)
WBC Count: 4.6 10*3/uL (ref 4.0–10.5)
nRBC: 0 % (ref 0.0–0.2)

## 2021-08-29 LAB — SAVE SMEAR(SSMR), FOR PROVIDER SLIDE REVIEW

## 2021-08-29 NOTE — Progress Notes (Signed)
Donna Wu ?CONSULT NOTE ? ?Patient Care Team: ?[name], MD as PCP - General (Family Medicine) ? ?CHIEF COMPLAINTS/PURPOSE OF CONSULTATION:  ?Abnormal labs.   ? ?ASSESSMENT & PLAN:  ? ?This is a very pleasant 76 year old female patient with past medical history significant for type 2 diabetes mellitus, hypertension, dyslipidemia, bilateral cataracts referred to hematology for evaluation of abnormal labs. ?We have reviewed labs which showed no evidence of monoclonal protein, mildly elevated kappa lambda ratio at 2.2, no elevated total protein, normal creatinine, no elevated calcium or alkaline phosphatase. She is here for FU.  ?Since last visit, she lost her husband in November and feels a little depressed.  Has been taking Zoloft every day.  She could benefit from some dose titration since she continues to feel low.  I encouraged her to talk to her PCP. ?With regards to labs, her hemoglobin is essentially stable, rest of the labs are pending at the time of my visit.  If there is no evidence of any abnormality such as presence of monoclonal protein or very low ferritin, she can be discharged from hematology.  She can continue to follow-up with her PCP for stable anemia and iron supplementation.  She expressed understanding of these recommendations.  Otherwise I will plan to see her back in 1 year. ?Age-appropriate cancer screening advised. ? ?Thank you for consulting [place] in the care of this patient.  Please not hesitate to contact us with any additional questions or concerns. ? ?HISTORY OF PRESENTING ILLNESS:  ? ?Donna Wu 76 y.o. female is here because of abnormal labs ? ?This is a very pleasant 76 year old female patient with past medical history significant for type 2 diabetes, hypertension, dyslipidemia, bilateral cataract referred to hematology for evaluation of abnormal labs which is primarily elevated kappa lambda ratio.  ? ?Interim History ? ?She is here for a follow up. Since  last visit, she lost her husband in November and since then she feels a little bit low, hasnt been having much of an appetite, lost about 10 lbs since last yr. she tells me that she was never 229 pounds, the wife must have been missed recorded.  She has been taking Zoloft for depression.She otherwise reports normal energy. ?She has been taking iron once a day, B12 and Vit D daily. ?No change in bowel habits ?No change in breathing, urinary habits ?No falls or seizures. ? ?Rest of the pertinent 10 point ROS reviewed and unremarkable. ? ? ?MEDICAL HISTORY:  ?Past Medical History:  ?Diagnosis Date  ? Anxiety   ? Arthritis   ? Bronchitis   ? hx of  ? Cataracts, bilateral   ? Colon polyps   ? Diabetes mellitus without complication ([place])   ? on oral medicine  ? GERD (gastroesophageal reflux disease)   ? H/O hiatal hernia   ? Hyperlipidemia   ? Hypertension   ? Dr. [name]   ? Pneumonia   ? "as a baby"  ? ? ?SURGICAL HISTORY: ?Past Surgical History:  ?Procedure Laterality Date  ? ABDOMINAL HYSTERECTOMY    ? BREAST BIOPSY Right   ? No Scar visible   ? BREAST LUMPECTOMY  app. 40 years ago  ? BREAST SURGERY    ? CARDIOVASCULAR STRESS TEST    ? done approx. 7 years ago  ? ESOPHAGOGASTRODUODENOSCOPY ENDOSCOPY    ? TOTAL KNEE ARTHROPLASTY  03/31/2012  ? Procedure: TOTAL KNEE ARTHROPLASTY;  Surgeon: Meredith Pel, MD;  Location: [facility];  Service: Orthopedics;  Laterality: Left;  Left total knee arthroplasty  ? TUBAL LIGATION    ? ? ?SOCIAL HISTORY: ?Social History  ? ?Socioeconomic History  ? Marital status: Legally Separated  ?  Spouse name: Not on file  ? Number of children: 4  ? Years of education: Not on file  ? Highest education level: Not on file  ?Occupational History  ? Not on file  ?Tobacco Use  ? Smoking status: Never  ? Smokeless tobacco: Never  ?Substance and Sexual Activity  ? Alcohol use: No  ? Drug use: No  ? Sexual activity: Not on file  ?Other Topics Concern  ? Not on file  ?Social History Narrative  ?  Not on file  ? ?Social Determinants of Health  ? ?Financial Resource Strain: Not on file  ?Food Insecurity: Not on file  ?Transportation Needs: Not on file  ?Physical Activity: Not on file  ?Stress: Not on file  ?Social Connections: Not on file  ?Intimate Partner Violence: Not on file  ? ? ?FAMILY HISTORY: ?Family History  ?Problem Relation Age of Onset  ? Hypertension Mother   ? Heart attack Mother 17  ? Dementia Father   ? Hypertension Brother   ? Hypertension Brother   ? Lung cancer Brother   ? Hypertension Sister   ? Hypertension Sister   ? Breast cancer Neg Hx   ? ? ?ALLERGIES:  is allergic to metformin and related. ? ?MEDICATIONS:  ?Current Outpatient Medications  ?Medication Sig Dispense Refill  ? acetaminophen (TYLENOL 8 HOUR) 650 MG CR tablet Take 650 tablets by mouth once. Once as needed.    ? aspirin 81 MG chewable tablet 1 tablet    ? atorvastatin (LIPITOR) 20 MG tablet Take 1 tablet (20 mg total) by mouth daily at 6 PM.    ? atorvastatin (LIPITOR) 20 MG tablet Take 1 tablet by mouth daily.    ? benazepril (LOTENSIN) 10 MG tablet Take 0.5 tablets (5 mg total) by mouth daily. (Patient taking differently: Take 10 mg by mouth daily.) 30 tablet 2  ? benazepril (LOTENSIN) 10 MG tablet Take 1 tablet by mouth daily.    ? Cholecalciferol (VITAMIN D) 50 MCG (2000 UT) tablet Take 50 tablets by mouth once.    ? Cholecalciferol (VITAMIN D3) 5000 UNITS CAPS Take 1 capsule by mouth daily.    ? diclofenac Sodium (VOLTAREN) 1 % GEL See admin instructions.    ? EQ GENTLE LAXATIVE 5 MG EC tablet See admin instructions.    ? ferrous sulfate 325 (65 FE) MG tablet Take 1 tablet (325 mg total) by mouth 2 (two) times daily with a meal. 120 tablet 0  ? hydrochlorothiazide (HYDRODIURIL) 25 MG tablet Take 25 mg by mouth daily.    ? metoprolol succinate (TOPROL-XL) 25 MG 24 hr tablet Take 25 mg by mouth daily.    ? metoprolol succinate (TOPROL-XL) 25 MG 24 hr tablet Take 1 tablet by mouth daily.    ? omeprazole (PRILOSEC) 40 MG  capsule Take 40 mg by mouth daily.    ? omeprazole (PRILOSEC) 40 MG capsule Take 1 capsule by mouth daily.    ? Oyster Shell Calcium 500 MG TABS 1 tablet with a meal    ? sertraline (ZOLOFT) 50 MG tablet Take 50 mg by mouth daily.    ? sertraline (ZOLOFT) 50 MG tablet Take 1 tablet by mouth daily.    ? Vitamin D, Ergocalciferol, (DRISDOL) 1.25 MG (50000 UNIT) CAPS capsule Take 50,000 Units by mouth once  a week.    ? ?No current facility-administered medications for this visit.  ? ? ? ?PHYSICAL EXAMINATION: ?ECOG PERFORMANCE STATUS: 0 - Asymptomatic ? ?Vitals:  ? 08/29/21 0919  ?BP: 135/68  ?Pulse: 77  ?Resp: 16  ?Temp: (!) 97.5 ?F (36.4 ?C)  ?SpO2: 98%  ? ?Filed Weights  ? 08/29/21 0919  ?Weight: 178 lb 9.6 oz (81 kg)  ? ? ?Physical Exam ?Constitutional:   ?   Appearance: Normal appearance.  ?Cardiovascular:  ?   Rate and Rhythm: Normal rate and regular rhythm.  ?   Pulses: Normal pulses.  ?   Heart sounds: Normal heart sounds.  ?Pulmonary:  ?   Effort: Pulmonary effort is normal.  ?   Breath sounds: Normal breath sounds.  ?Musculoskeletal:     ?   General: No swelling or tenderness. Normal range of motion.  ?   Cervical back: Normal range of motion and neck supple. No rigidity.  ?Lymphadenopathy:  ?   Cervical: No cervical adenopathy.  ?Skin: ?   General: Skin is warm and dry.  ?Neurological:  ?   Mental Status: She is alert.  ? ? ? ?LABORATORY DATA:  ?I have reviewed the data as listed ?Lab Results  ?Component Value Date  ? WBC 4.6 08/29/2021  ? HGB 10.2 (L) 08/29/2021  ? HCT 32.1 (L) 08/29/2021  ? MCV 78.3 (L) 08/29/2021  ? PLT 244 08/29/2021  ? ?  Chemistry   ?   ?Component Value Date/Time  ? NA 139 08/29/2021 0852  ? K 3.7 08/29/2021 0852  ? CL 105 08/29/2021 0852  ? CO2 28 08/29/2021 0852  ? BUN 15 08/29/2021 0852  ? CREATININE 0.79 08/29/2021 0852  ?    ?Component Value Date/Time  ? CALCIUM 9.4 08/29/2021 0852  ? ALKPHOS 78 08/29/2021 0852  ? AST 10 (L) 08/29/2021 1914  ? ALT 8 08/29/2021 0852  ? BILITOT 0.4  08/29/2021 0852  ?  ? ?Reviewed labs. ? ?RADIOGRAPHIC STUDIES: ?I have personally reviewed the radiological images as listed and agreed with the findings in the report. ?DG BONE DENSITY (DXA) ? ?Result D

## 2021-08-30 LAB — KAPPA/LAMBDA LIGHT CHAINS
Kappa free light chain: 22.6 mg/L — ABNORMAL HIGH (ref 3.3–19.4)
Kappa, lambda light chain ratio: 2.11 — ABNORMAL HIGH (ref 0.26–1.65)
Lambda free light chains: 10.7 mg/L (ref 5.7–26.3)

## 2021-08-30 LAB — PROTEIN ELECTROPHORESIS, SERUM
A/G Ratio: 1.2 (ref 0.7–1.7)
Albumin ELP: 3.6 g/dL (ref 2.9–4.4)
Alpha-1-Globulin: 0.2 g/dL (ref 0.0–0.4)
Alpha-2-Globulin: 0.7 g/dL (ref 0.4–1.0)
Beta Globulin: 0.8 g/dL (ref 0.7–1.3)
Gamma Globulin: 1.1 g/dL (ref 0.4–1.8)
Globulin, Total: 2.9 g/dL (ref 2.2–3.9)
Total Protein ELP: 6.5 g/dL (ref 6.0–8.5)

## 2021-08-31 LAB — IGG 4: IgG, Subclass 4: 38 mg/dL (ref 2–96)

## 2021-09-01 NOTE — Progress Notes (Incomplete)
?Cardiology Office Note:   ? ?Date:  09/01/2021  ? ?ID:  Donna Wu, DOB 09-04-1945, MRN [id number] ? ?PCP:  [name], MD  ?Cardiologist:  None  ? ?Referring MD: [name], MD  ? ?No chief complaint on file. ? ? ?History of Present Illness:   ? ?Donna Wu is a 76 y.o. female with a hx of  DM II, hyperlipidemia, and hypertension referred by Dr. Stephanie Wu for evaluation of syncope. ? ?*** ? ?Past Medical History:  ?Diagnosis Date  ? Anxiety   ? Arthritis   ? Bronchitis   ? hx of  ? Cataracts, bilateral   ? Colon polyps   ? Diabetes mellitus without complication ([place])   ? on oral medicine  ? GERD (gastroesophageal reflux disease)   ? H/O hiatal hernia   ? Hyperlipidemia   ? Hypertension   ? Dr. [name]   ? Pneumonia   ? "as a baby"  ? ? ?Past Surgical History:  ?Procedure Laterality Date  ? ABDOMINAL HYSTERECTOMY    ? BREAST BIOPSY Right   ? No Scar visible   ? BREAST LUMPECTOMY  app. 40 years ago  ? BREAST SURGERY    ? CARDIOVASCULAR STRESS TEST    ? done approx. 7 years ago  ? ESOPHAGOGASTRODUODENOSCOPY ENDOSCOPY    ? TOTAL KNEE ARTHROPLASTY  03/31/2012  ? Procedure: TOTAL KNEE ARTHROPLASTY;  Surgeon: Meredith Pel, MD;  Location: [facility];  Service: Orthopedics;  Laterality: Left;  Left total knee arthroplasty  ? TUBAL LIGATION    ? ? ?Current Medications: ?No outpatient medications have been marked as taking for the 09/03/21 encounter (Appointment) with Belva Crome, MD.  ?  ? ?Allergies:   Metformin and related  ? ?Social History  ? ?Socioeconomic History  ? Marital status: Legally Separated  ?  Spouse name: Not on file  ? Number of children: 4  ? Years of education: Not on file  ? Highest education level: Not on file  ?Occupational History  ? Not on file  ?Tobacco Use  ? Smoking status: Never  ? Smokeless tobacco: Never  ?Substance and Sexual Activity  ? Alcohol use: No  ? Drug use: No  ? Sexual activity: Not on file  ?Other Topics Concern  ? Not on file  ?Social History  Narrative  ? Not on file  ? ?Social Determinants of Health  ? ?Financial Resource Strain: Not on file  ?Food Insecurity: Not on file  ?Transportation Needs: Not on file  ?Physical Activity: Not on file  ?Stress: Not on file  ?Social Connections: Not on file  ?  ? ?Family History: ?The patient's family history includes Dementia in her father; Heart attack (age of onset: 72) in her mother; Hypertension in her brother, brother, mother, sister, and sister; Lung cancer in her brother. There is no history of Breast cancer. ? ?ROS:   ?Please see the history of present illness.    ?*** All other systems reviewed and are negative. ? ?EKGs/Labs/Other Studies Reviewed:   ? ?The following studies were reviewed today: ?*** ? ?EKG:  EKG *** ? ?Recent Labs: ?06/30/2021: Magnesium 1.9 ?08/29/2021: ALT 8; BUN 15; Creatinine 0.79; Hemoglobin 10.2; Platelet Count 244; Potassium 3.7; Sodium 139  ?Recent Lipid Panel ?No results found for: CHOL, TRIG, HDL, CHOLHDL, VLDL, LDLCALC, LDLDIRECT ? ?Physical Exam:   ? ?VS:  There were no vitals taken for this visit.   ? ?Wt Readings from Last 3 Encounters:  ?08/29/21 178 lb  9.6 oz (81 kg)  ?08/30/20 229 lb 8 oz (104.1 kg)  ?08/15/20 187 lb 3.2 oz (84.9 kg)  ?  ? ?GEN: ***. No acute distress ?HEENT: Normal ?NECK: No JVD. ?LYMPHATICS: No lymphadenopathy ?CARDIAC: *** murmur. RRR *** gallop, or edema. ?VASCULAR: *** Normal Pulses. No bruits. ?RESPIRATORY:  Clear to auscultation without rales, wheezing or rhonchi  ?ABDOMEN: Soft, non-tender, non-distended, No pulsatile mass, ?MUSCULOSKELETAL: No deformity  ?SKIN: Warm and dry ?NEUROLOGIC:  Alert and oriented x 3 ?PSYCHIATRIC:  Normal affect  ? ?ASSESSMENT:   ? ?1. Syncope, unspecified syncope type   ?2. Hyperlipidemia, unspecified hyperlipidemia type   ?3. Essential hypertension   ?4. Controlled type 2 diabetes mellitus with hyperglycemia, without long-term current use of insulin ([place])   ? ?PLAN:   ? ?In order of problems listed  above: ? ?*** ? ? ?Medication Adjustments/Labs and Tests Ordered: ?Current medicines are reviewed at length with the patient today.  Concerns regarding medicines are outlined above.  ?No orders of the defined types were placed in this encounter. ? ?No orders of the defined types were placed in this encounter. ? ? ?There are no Patient Instructions on file for this visit.  ? ?Signed, ?Sinclair Grooms, MD  ?09/01/2021 2:37 PM    ?[place] ?

## 2021-09-03 ENCOUNTER — Ambulatory Visit: Payer: Medicare Other | Admitting: Interventional Cardiology

## 2021-09-03 DIAGNOSIS — R55 Syncope and collapse: Secondary | ICD-10-CM

## 2021-09-03 DIAGNOSIS — E785 Hyperlipidemia, unspecified: Secondary | ICD-10-CM

## 2021-09-03 DIAGNOSIS — E1165 Type 2 diabetes mellitus with hyperglycemia: Secondary | ICD-10-CM

## 2021-09-03 DIAGNOSIS — I10 Essential (primary) hypertension: Secondary | ICD-10-CM

## 2021-10-01 DIAGNOSIS — K219 Gastro-esophageal reflux disease without esophagitis: Secondary | ICD-10-CM | POA: Diagnosis not present

## 2021-10-01 DIAGNOSIS — M1990 Unspecified osteoarthritis, unspecified site: Secondary | ICD-10-CM | POA: Diagnosis not present

## 2021-10-01 DIAGNOSIS — E785 Hyperlipidemia, unspecified: Secondary | ICD-10-CM | POA: Diagnosis not present

## 2021-10-01 DIAGNOSIS — I10 Essential (primary) hypertension: Secondary | ICD-10-CM | POA: Diagnosis not present

## 2021-10-01 DIAGNOSIS — M8580 Other specified disorders of bone density and structure, unspecified site: Secondary | ICD-10-CM | POA: Diagnosis not present

## 2021-10-01 DIAGNOSIS — N182 Chronic kidney disease, stage 2 (mild): Secondary | ICD-10-CM | POA: Diagnosis not present

## 2021-10-01 DIAGNOSIS — E1169 Type 2 diabetes mellitus with other specified complication: Secondary | ICD-10-CM | POA: Diagnosis not present

## 2021-10-22 ENCOUNTER — Ambulatory Visit (HOSPITAL_BASED_OUTPATIENT_CLINIC_OR_DEPARTMENT_OTHER): Payer: Medicare Other | Admitting: Cardiovascular Disease

## 2021-10-22 DIAGNOSIS — I10 Essential (primary) hypertension: Secondary | ICD-10-CM | POA: Diagnosis not present

## 2021-10-22 DIAGNOSIS — G4733 Obstructive sleep apnea (adult) (pediatric): Secondary | ICD-10-CM | POA: Diagnosis not present

## 2021-10-22 DIAGNOSIS — E1122 Type 2 diabetes mellitus with diabetic chronic kidney disease: Secondary | ICD-10-CM | POA: Diagnosis not present

## 2021-11-22 DIAGNOSIS — G4733 Obstructive sleep apnea (adult) (pediatric): Secondary | ICD-10-CM | POA: Diagnosis not present

## 2021-12-23 DIAGNOSIS — G4733 Obstructive sleep apnea (adult) (pediatric): Secondary | ICD-10-CM | POA: Diagnosis not present

## 2021-12-24 ENCOUNTER — Ambulatory Visit (INDEPENDENT_AMBULATORY_CARE_PROVIDER_SITE_OTHER): Payer: Medicare Other | Admitting: Cardiovascular Disease

## 2021-12-24 ENCOUNTER — Encounter (HOSPITAL_BASED_OUTPATIENT_CLINIC_OR_DEPARTMENT_OTHER): Payer: Self-pay | Admitting: Cardiovascular Disease

## 2021-12-24 VITALS — BP 180/82 | HR 72 | Ht 63.0 in | Wt 177.7 lb

## 2021-12-24 DIAGNOSIS — Z5181 Encounter for therapeutic drug level monitoring: Secondary | ICD-10-CM

## 2021-12-24 DIAGNOSIS — I6523 Occlusion and stenosis of bilateral carotid arteries: Secondary | ICD-10-CM | POA: Diagnosis not present

## 2021-12-24 DIAGNOSIS — I10 Essential (primary) hypertension: Secondary | ICD-10-CM

## 2021-12-24 DIAGNOSIS — R55 Syncope and collapse: Secondary | ICD-10-CM

## 2021-12-24 DIAGNOSIS — I6529 Occlusion and stenosis of unspecified carotid artery: Secondary | ICD-10-CM | POA: Insufficient documentation

## 2021-12-24 HISTORY — DX: Occlusion and stenosis of unspecified carotid artery: I65.29
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
## 2021-12-24 NOTE — Assessment & Plan Note (Signed)
It seems that her syncopal episode was actually falling asleep in the setting of untreated sleep apnea.  She has been appropriately started on a CPAP.  Her workup was otherwise unremarkable with only mild stenosis on carotid Dopplers and no contributory arrhythmias on monitoring.  No significant findings on echo.  She has been cleared to resume driving and I think that is okay.

## 2021-12-24 NOTE — Assessment & Plan Note (Signed)
Continue atorvastatin.  LDL goal less than 70.

## 2021-12-24 NOTE — Patient Instructions (Addendum)
Medication Instructions:  RESTART YOUR HYDROCHLOROTHIAZIDE 25 MG DAILY   *If you need a refill on your cardiac medications before your next appointment, please call your pharmacy*  Lab Work: BMET IN 1 WEEK   If you have labs (blood work) drawn today and your tests are completely normal, you will receive your results only by: [place] (if you have MyChart) OR A paper copy in the mail If you have any lab test that is abnormal or we need to change your treatment, we will call you to review the results.  Testing/Procedures: NONE  Follow-Up: At [facility], you and your health needs are our priority.  As part of our continuing mission to provide you with exceptional heart care, we have created designated Provider Care Teams.  These Care Teams include your primary Cardiologist (physician) and Advanced Practice Providers (APPs -  Physician Assistants and Nurse Practitioners) who all work together to provide you with the care you need, when you need it.  We recommend signing up for the patient portal called "MyChart".  Sign up information is provided on this After Visit Summary.  MyChart is used to connect with patients for Virtual Visits (Telemedicine).  Patients are able to view lab/test results, encounter notes, upcoming appointments, etc.  Non-urgent messages can be sent to your provider as well.   To learn more about what you can do with MyChart, go to [email or web address].    Your next appointment:   12 month(s)  The format for your next appointment:   In Person  Provider:   Skeet Latch, MD{  01/23/2022 9:00 am with Pharm D   [facility]OR[facility] OR PAM FROM THE PREP [facility]) PROGRAM

## 2021-12-24 NOTE — Assessment & Plan Note (Signed)
Mild bilateral carotid stenosis 06/2021.  Repeat at follow-up.  Continue aspirin and atorvastatin.  LDL goal is less than 70.  Referring to exercise program as above.
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
## 2021-12-27 ENCOUNTER — Telehealth: Payer: Self-pay

## 2021-12-27 NOTE — Telephone Encounter (Signed)
Called to discuss PREP program, she would like to attend at [facility], but no classes available; offered [facility], she would like to talk with Pam RN [facility] about the schedule there and will decide if she wants to attend there or come to [place] later this year when classes scheduled there.

## 2022-01-10 DIAGNOSIS — N182 Chronic kidney disease, stage 2 (mild): Secondary | ICD-10-CM | POA: Diagnosis not present

## 2022-01-10 DIAGNOSIS — E785 Hyperlipidemia, unspecified: Secondary | ICD-10-CM | POA: Diagnosis not present

## 2022-01-10 DIAGNOSIS — E1122 Type 2 diabetes mellitus with diabetic chronic kidney disease: Secondary | ICD-10-CM | POA: Diagnosis not present

## 2022-01-10 DIAGNOSIS — I10 Essential (primary) hypertension: Secondary | ICD-10-CM | POA: Diagnosis not present

## 2022-01-10 DIAGNOSIS — K219 Gastro-esophageal reflux disease without esophagitis: Secondary | ICD-10-CM | POA: Diagnosis not present

## 2022-01-23 ENCOUNTER — Ambulatory Visit (HOSPITAL_BASED_OUTPATIENT_CLINIC_OR_DEPARTMENT_OTHER): Payer: Medicare Other

## 2022-01-23 DIAGNOSIS — G4733 Obstructive sleep apnea (adult) (pediatric): Secondary | ICD-10-CM | POA: Diagnosis not present

## 2022-01-23 NOTE — Progress Notes (Deleted)
01/23/2022 [name] 1945-08-28 [id number]   HPI:  Donna Wu is a 76 y.o. female patient of Dr Oval Linsey, with a [place] below who presents today for hypertension clinic evaluation.  She was seen by Dr. Oval Linsey last month, at which time her pressure was noted to be 180/82.  HCTZ, which had previously been d/c'd was resumed at 25 mg daily.  Needs labs to check K  Past Medical History: hyperlipidemia 4/23 - LDL 74 - on atorvastatin 20  DM2 4/23 - A1c 6.9 - diet/exercise controlled  ASCVD Carotid stenosis - mild  OSA On CPAP        Blood Pressure Goal:  130/80  Current Medications:   benazepril, metoprolol, hctz 25 mg  Family Hx:     Social Hx:      Diet:      Exercise: PREP referral  Home BP readings:      Intolerances:    Labs:    Wt Readings from Last 3 Encounters:  12/24/21 177 lb 11.2 oz (80.6 kg)  08/29/21 178 lb 9.6 oz (81 kg)  08/30/20 229 lb 8 oz (104.1 kg)   BP Readings from Last 3 Encounters:  12/24/21 (!) 180/82  08/29/21 135/68  06/30/21 128/64   Pulse Readings from Last 3 Encounters:  12/24/21 72  08/29/21 77  06/30/21 78    Current Outpatient Medications  Medication Sig Dispense Refill   acetaminophen (TYLENOL 8 HOUR) 650 MG CR tablet Take 650 tablets by mouth once. Once as needed.     aspirin 81 MG chewable tablet 1 tablet     atorvastatin (LIPITOR) 20 MG tablet Take 1 tablet by mouth daily.     benazepril (LOTENSIN) 10 MG tablet Take 1 tablet by mouth daily.     Cholecalciferol (VITAMIN D) 50 MCG (2000 UT) tablet Take 50 tablets by mouth once.     diclofenac Sodium (VOLTAREN) 1 % GEL See admin instructions.     EQ GENTLE LAXATIVE 5 MG EC tablet See admin instructions.     famotidine (PEPCID) 40 MG tablet Take 40 mg by mouth daily.     ferrous sulfate 325 (65 FE) MG tablet Take 1 tablet (325 mg total) by mouth 2 (two) times daily with a meal. 120 tablet 0   hydrochlorothiazide (HYDRODIURIL) 25 MG tablet Take 25 mg by  mouth daily.     metoprolol succinate (TOPROL-XL) 25 MG 24 hr tablet Take 25 mg by mouth daily.     omeprazole (PRILOSEC) 40 MG capsule Take 40 mg by mouth daily.     Oyster Shell Calcium 500 MG TABS 1 tablet with a meal     sertraline (ZOLOFT) 50 MG tablet Take 50 mg by mouth daily.     No current facility-administered medications for this visit.    Allergies  Allergen Reactions   Metformin And Related Diarrhea    Past Medical History:  Diagnosis Date   Anxiety    Arthritis    Bronchitis    hx of   Carotid stenosis 12/24/2021   Cataracts, bilateral    Colon polyps    Diabetes mellitus without complication (HCC)    on oral medicine   GERD (gastroesophageal reflux disease)    H/O hiatal hernia    Hyperlipidemia    Hypertension    Dr. [name]    Pneumonia    "as a baby"    There were no vitals taken for this visit.  No BP recorded.  {  Refresh Note OR Click here to enter BP  :1}***    No problem-specific Assessment & Plan notes found for this encounter.   Tommy Medal PharmD CPP [facility] [address] [place], [place] [address] [phone]

## 2022-01-31 DIAGNOSIS — Z23 Encounter for immunization: Secondary | ICD-10-CM | POA: Diagnosis not present

## 2022-02-05 DIAGNOSIS — I10 Essential (primary) hypertension: Secondary | ICD-10-CM | POA: Diagnosis not present

## 2022-02-05 DIAGNOSIS — G4733 Obstructive sleep apnea (adult) (pediatric): Secondary | ICD-10-CM | POA: Diagnosis not present

## 2022-02-18 DIAGNOSIS — E1169 Type 2 diabetes mellitus with other specified complication: Secondary | ICD-10-CM | POA: Diagnosis not present

## 2022-02-18 DIAGNOSIS — R768 Other specified abnormal immunological findings in serum: Secondary | ICD-10-CM | POA: Diagnosis not present

## 2022-02-18 DIAGNOSIS — E538 Deficiency of other specified B group vitamins: Secondary | ICD-10-CM | POA: Diagnosis not present

## 2022-02-18 DIAGNOSIS — D509 Iron deficiency anemia, unspecified: Secondary | ICD-10-CM | POA: Diagnosis not present

## 2022-02-18 DIAGNOSIS — M1711 Unilateral primary osteoarthritis, right knee: Secondary | ICD-10-CM | POA: Diagnosis not present

## 2022-02-22 ENCOUNTER — Encounter: Payer: Self-pay | Admitting: Hematology and Oncology

## 2022-03-08 DIAGNOSIS — G4733 Obstructive sleep apnea (adult) (pediatric): Secondary | ICD-10-CM | POA: Diagnosis not present

## 2022-03-11 ENCOUNTER — Ambulatory Visit (INDEPENDENT_AMBULATORY_CARE_PROVIDER_SITE_OTHER): Payer: Medicare Other | Admitting: Pharmacist Clinician (PhC)/ Clinical Pharmacy Specialist

## 2022-03-11 ENCOUNTER — Encounter (HOSPITAL_BASED_OUTPATIENT_CLINIC_OR_DEPARTMENT_OTHER): Payer: Self-pay | Admitting: Pharmacist Clinician (PhC)/ Clinical Pharmacy Specialist

## 2022-03-11 ENCOUNTER — Telehealth: Payer: Self-pay

## 2022-03-11 DIAGNOSIS — I10 Essential (primary) hypertension: Secondary | ICD-10-CM | POA: Diagnosis not present

## 2022-03-11 NOTE — Assessment & Plan Note (Signed)
Patient with essential hypertension, doing well overall and only slightly above BP goal.  Before adding more medications, will work on dividing medications.  She will move benazepril to mornings and continue metoprolol succ at night.  I will see her back in 3 months for follow up.  Can consider increasing benazepril to 20 mg if still not to BP goals at that time.

## 2022-03-11 NOTE — Telephone Encounter (Signed)
Received email from [place] reference when pt could do PREP class Originally requested [facility] pt requesting call back Offered next 3 classes starting in Oct, and Nov

## 2022-03-11 NOTE — Patient Instructions (Signed)
Return for a a follow up appointment in January.  I will reach out to you when that schedule is availble  Check your blood pressure at home daily and keep record of the readings.  Take your BP meds as follows:  Switch benazepril to mornings, starting tomorrow.  Continue all your current medications  Bring all of your meds, your BP cuff and your record of home blood pressures to your next appointment.  Exercise as you're able, try to walk approximately 30 minutes per day.  Keep salt intake to a minimum, especially watch canned and prepared boxed foods.  Eat more fresh fruits and vegetables and fewer canned items.  Avoid eating in fast food restaurants.    HOW TO TAKE YOUR BLOOD PRESSURE: Rest 5 minutes before taking your blood pressure.  Don't smoke or drink caffeinated beverages for at least 30 minutes before. Take your blood pressure before (not after) you eat. Sit comfortably with your back supported and both feet on the floor (don't cross your legs). Elevate your arm to heart level on a table or a desk. Use the proper sized cuff. It should fit smoothly and snugly around your bare upper arm. There should be enough room to slip a fingertip under the cuff. The bottom edge of the cuff should be 1 inch above the crease of the elbow. Ideally, take 3 measurements at one sitting and record the average.
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
## 2022-03-28 ENCOUNTER — Other Ambulatory Visit: Payer: Self-pay | Admitting: Family Medicine

## 2022-03-28 DIAGNOSIS — Z1231 Encounter for screening mammogram for malignant neoplasm of breast: Secondary | ICD-10-CM

## 2022-04-29 ENCOUNTER — Ambulatory Visit
Admission: RE | Admit: 2022-04-29 | Discharge: 2022-04-29 | Disposition: A | Discharge status: Home / Self Care | Payer: Medicare Other | Source: Ambulatory Visit | Attending: Family Medicine | Admitting: Family Medicine

## 2022-04-29 DIAGNOSIS — Z1231 Encounter for screening mammogram for malignant neoplasm of breast: Secondary | ICD-10-CM | POA: Diagnosis not present

## 2022-06-15 DIAGNOSIS — M5432 Sciatica, left side: Secondary | ICD-10-CM | POA: Diagnosis not present

## 2022-08-27 ENCOUNTER — Telehealth: Payer: Self-pay | Admitting: Hematology and Oncology

## 2022-08-27 NOTE — Telephone Encounter (Signed)
patient called to reschedule appointment.

## 2022-08-28 ENCOUNTER — Other Ambulatory Visit: Payer: Self-pay

## 2022-08-28 ENCOUNTER — Inpatient Hospital Stay: Payer: Medicare Other | Admitting: Hematology and Oncology

## 2022-08-28 ENCOUNTER — Inpatient Hospital Stay: Payer: Medicare Other

## 2022-08-28 DIAGNOSIS — H43812 Vitreous degeneration, left eye: Secondary | ICD-10-CM | POA: Diagnosis not present

## 2022-08-28 DIAGNOSIS — D649 Anemia, unspecified: Secondary | ICD-10-CM

## 2022-08-30 DIAGNOSIS — Z0000 Encounter for general adult medical examination without abnormal findings: Secondary | ICD-10-CM | POA: Diagnosis not present

## 2022-08-30 DIAGNOSIS — E559 Vitamin D deficiency, unspecified: Secondary | ICD-10-CM | POA: Diagnosis not present

## 2022-08-30 DIAGNOSIS — E1169 Type 2 diabetes mellitus with other specified complication: Secondary | ICD-10-CM | POA: Diagnosis not present

## 2022-08-30 DIAGNOSIS — E1122 Type 2 diabetes mellitus with diabetic chronic kidney disease: Secondary | ICD-10-CM | POA: Diagnosis not present

## 2022-08-30 DIAGNOSIS — I700 Atherosclerosis of aorta: Secondary | ICD-10-CM | POA: Diagnosis not present

## 2022-08-30 DIAGNOSIS — D509 Iron deficiency anemia, unspecified: Secondary | ICD-10-CM | POA: Diagnosis not present

## 2022-08-30 DIAGNOSIS — I471 Supraventricular tachycardia, unspecified: Secondary | ICD-10-CM | POA: Diagnosis not present

## 2022-08-30 DIAGNOSIS — Z79899 Other long term (current) drug therapy: Secondary | ICD-10-CM | POA: Diagnosis not present

## 2022-08-30 DIAGNOSIS — E538 Deficiency of other specified B group vitamins: Secondary | ICD-10-CM | POA: Diagnosis not present

## 2022-08-30 DIAGNOSIS — N182 Chronic kidney disease, stage 2 (mild): Secondary | ICD-10-CM | POA: Diagnosis not present

## 2022-09-10 ENCOUNTER — Encounter (HOSPITAL_BASED_OUTPATIENT_CLINIC_OR_DEPARTMENT_OTHER): Payer: Self-pay

## 2022-09-16 ENCOUNTER — Inpatient Hospital Stay: Payer: Medicare Other | Attending: Nurse Practitioner

## 2022-09-16 ENCOUNTER — Inpatient Hospital Stay: Payer: Medicare Other | Admitting: Hematology and Oncology

## 2022-09-16 ENCOUNTER — Other Ambulatory Visit: Payer: Self-pay

## 2022-09-16 DIAGNOSIS — R779 Abnormality of plasma protein, unspecified: Secondary | ICD-10-CM | POA: Diagnosis not present

## 2022-09-16 DIAGNOSIS — E785 Hyperlipidemia, unspecified: Secondary | ICD-10-CM | POA: Insufficient documentation

## 2022-09-16 DIAGNOSIS — Z79899 Other long term (current) drug therapy: Secondary | ICD-10-CM | POA: Insufficient documentation

## 2022-09-16 DIAGNOSIS — Z801 Family history of malignant neoplasm of trachea, bronchus and lung: Secondary | ICD-10-CM | POA: Diagnosis not present

## 2022-09-16 DIAGNOSIS — I10 Essential (primary) hypertension: Secondary | ICD-10-CM | POA: Insufficient documentation

## 2022-09-16 DIAGNOSIS — D649 Anemia, unspecified: Secondary | ICD-10-CM

## 2022-09-16 DIAGNOSIS — E119 Type 2 diabetes mellitus without complications: Secondary | ICD-10-CM | POA: Insufficient documentation

## 2022-09-16 LAB — CBC WITH DIFFERENTIAL (CANCER CENTER ONLY)
Abs Immature Granulocytes: 0.01 10*3/uL (ref 0.00–0.07)
Basophils Absolute: 0 10*3/uL (ref 0.0–0.1)
Basophils Relative: 1 %
Eosinophils Absolute: 0.2 10*3/uL (ref 0.0–0.5)
Eosinophils Relative: 4 %
HCT: 34.6 % — ABNORMAL LOW (ref 36.0–46.0)
Hemoglobin: 11.2 g/dL — ABNORMAL LOW (ref 12.0–15.0)
Immature Granulocytes: 0 %
Lymphocytes Relative: 31 %
Lymphs Abs: 1.3 10*3/uL (ref 0.7–4.0)
MCH: 26.1 pg (ref 26.0–34.0)
MCHC: 32.4 g/dL (ref 30.0–36.0)
MCV: 80.7 fL (ref 80.0–100.0)
Monocytes Absolute: 0.3 10*3/uL (ref 0.1–1.0)
Monocytes Relative: 8 %
Neutro Abs: 2.4 10*3/uL (ref 1.7–7.7)
Neutrophils Relative %: 56 %
Platelet Count: 225 10*3/uL (ref 150–400)
RBC: 4.29 MIL/uL (ref 3.87–5.11)
RDW: 14.8 % (ref 11.5–15.5)
WBC Count: 4.2 10*3/uL (ref 4.0–10.5)
nRBC: 0 % (ref 0.0–0.2)

## 2022-09-16 LAB — CMP (CANCER CENTER ONLY)
ALT: 7 U/L (ref 0–44)
AST: 11 U/L — ABNORMAL LOW (ref 15–41)
Albumin: 4.4 g/dL (ref 3.5–5.0)
Alkaline Phosphatase: 85 U/L (ref 38–126)
Anion gap: 7 (ref 5–15)
BUN: 19 mg/dL (ref 8–23)
CO2: 29 mmol/L (ref 22–32)
Calcium: 10.1 mg/dL (ref 8.9–10.3)
Chloride: 104 mmol/L (ref 98–111)
Creatinine: 0.84 mg/dL (ref 0.44–1.00)
GFR, Estimated: >60 mL/min (ref >60)
Glucose, Bld: 143 mg/dL — ABNORMAL HIGH (ref 70–99)
Potassium: 3.7 mmol/L (ref 3.5–5.1)
Sodium: 140 mmol/L (ref 135–145)
Total Bilirubin: 0.5 mg/dL (ref 0.3–1.2)
Total Protein: 7.2 g/dL (ref 6.5–8.1)

## 2022-09-16 NOTE — Progress Notes (Signed)
[facility] CONSULT NOTE  Patient Care Team: Mila Palmer, MD as PCP - General (Family Medicine) Chilton Si, MD as Attending Physician (Cardiology)  CHIEF COMPLAINTS/PURPOSE OF CONSULTATION:  Abnormal labs.    ASSESSMENT & PLAN:   This is a very pleasant 77 year old female patient with past medical history significant for type 2 diabetes mellitus, hypertension, dyslipidemia, bilateral cataracts referred to hematology for evaluation of abnormal labs.  We have reviewed labs which showed no evidence of monoclonal protein, mildly elevated kappa lambda ratio at 2.2, no elevated total protein, normal creatinine, no elevated calcium or alkaline phosphatase. She is here for FU.   Since her last visit here, she lost her husband, she has been feeling sad about it.  She continues to lose some weight but this is most likely because of lack of appetite and situational depression.  No concerns on physical exam.  CBC today with improving hemoglobin.  Rest of the labs are pending.  If all her labs continue to be stable, she can return to clinic in 1 year.  She is agreeable to these recommendations.  Thank you for consulting [place] in the care of this patient.  Please not hesitate to contact us with any additional questions or concerns.  HISTORY OF PRESENTING ILLNESS:   Donna Wu 77 y.o. female is here because of abnormal labs  This is a very pleasant 77 year old female patient with past medical history significant for type 2 diabetes, hypertension, dyslipidemia, bilateral cataract referred to hematology for evaluation of abnormal labs which is primarily elevated kappa lambda ratio.   Interim History  She is here for a follow up.  Since her last visit here, her husband died of congestive heart failure.  She denies any other complaints today.  She tells me that she does not have much appetite, she has been losing weight, lost about 8 pounds or so since her last visit in  October. She has been taking iron once a day, B12 and Vit D daily. No change in bowel habits.  No hematochezia or melena No change in breathing, urinary habits No falls or seizures.  Rest of the pertinent 10 point ROS reviewed and unremarkable.   MEDICAL HISTORY:  Past Medical History:  Diagnosis Date   Anxiety    Arthritis    Bronchitis    hx of   Carotid stenosis 12/24/2021   Cataracts, bilateral    Colon polyps    Diabetes mellitus without complication (HCC)    on oral medicine   GERD (gastroesophageal reflux disease)    H/O hiatal hernia    Hyperlipidemia    Hypertension    Dr. Johnn Hai    Pneumonia    "as a baby"    SURGICAL HISTORY: Past Surgical History:  Procedure Laterality Date   ABDOMINAL HYSTERECTOMY     BREAST BIOPSY Right    No Scar visible    BREAST LUMPECTOMY  app. 40 years ago   BREAST SURGERY     CARDIOVASCULAR STRESS TEST     done approx. 7 years ago   ESOPHAGOGASTRODUODENOSCOPY ENDOSCOPY     TOTAL KNEE ARTHROPLASTY  03/31/2012   Procedure: TOTAL KNEE ARTHROPLASTY;  Surgeon: Cammy Copa, MD;  Location: [facility] OR;  Service: Orthopedics;  Laterality: Left;  Left total knee arthroplasty   TUBAL LIGATION      SOCIAL HISTORY: Social History   Socioeconomic History   Marital status: Legally Separated    Spouse name: Not on file   Number of  children: 4   Years of education: Not on file   Highest education level: Not on file  Occupational History   Not on file  Tobacco Use   Smoking status: Never   Smokeless tobacco: Never  Substance and Sexual Activity   Alcohol use: No   Drug use: No   Sexual activity: Not on file  Other Topics Concern   Not on file  Social History Narrative   Not on file   Social Determinants of Health   Financial Resource Strain: Not on file  Food Insecurity: Not on file  Transportation Needs: Not on file  Physical Activity: Not on file  Stress: Not on file  Social Connections: Not on file  Intimate  Partner Violence: Not on file    FAMILY HISTORY: Family History  Problem Relation Age of Onset   Hypertension Mother    Heart attack Mother 17   Stroke Mother    Stroke Father    Dementia Father    Hypertension Sister    Hypertension Sister    Hypertension Brother    Hypertension Brother    Lung cancer Brother    Breast cancer Neg Hx     ALLERGIES:  is allergic to metformin and related.  MEDICATIONS:  Current Outpatient Medications  Medication Sig Dispense Refill   acetaminophen (TYLENOL 8 HOUR) 650 MG CR tablet Take 650 tablets by mouth once. Once as needed.     aspirin 81 MG chewable tablet 1 tablet     atorvastatin (LIPITOR) 20 MG tablet Take 1 tablet by mouth daily.     benazepril (LOTENSIN) 10 MG tablet Take 1 tablet by mouth daily.     Cholecalciferol (VITAMIN D) 50 MCG (2000 UT) tablet Take 50 tablets by mouth once.     diclofenac Sodium (VOLTAREN) 1 % GEL See admin instructions.     EQ GENTLE LAXATIVE 5 MG EC tablet See admin instructions.     famotidine (PEPCID) 40 MG tablet Take 40 mg by mouth daily.     ferrous sulfate 325 (65 FE) MG tablet Take 1 tablet (325 mg total) by mouth 2 (two) times daily with a meal. 120 tablet 0   hydrochlorothiazide (HYDRODIURIL) 25 MG tablet Take 25 mg by mouth daily.     metoprolol succinate (TOPROL-XL) 25 MG 24 hr tablet Take 25 mg by mouth daily.     omeprazole (PRILOSEC) 40 MG capsule Take 40 mg by mouth daily.     Oyster Shell Calcium 500 MG TABS 1 tablet with a meal     sertraline (ZOLOFT) 50 MG tablet Take 50 mg by mouth daily.     vitamin B-12 (CYANOCOBALAMIN) 500 MCG tablet Take 500 mcg by mouth in the morning and at bedtime.     No current facility-administered medications for this visit.     PHYSICAL EXAMINATION: ECOG PERFORMANCE STATUS: 0 - Asymptomatic  There were no vitals filed for this visit.  There were no vitals filed for this visit.   Physical Exam Constitutional:      Appearance: Normal appearance.   Cardiovascular:     Rate and Rhythm: Normal rate and regular rhythm.     Pulses: Normal pulses.     Heart sounds: Normal heart sounds.  Pulmonary:     Effort: Pulmonary effort is normal.     Breath sounds: Normal breath sounds.  Musculoskeletal:        General: No swelling or tenderness. Normal range of motion.     Cervical back: Normal  range of motion and neck supple. No rigidity.  Lymphadenopathy:     Cervical: No cervical adenopathy.  Skin:    General: Skin is warm and dry.  Neurological:     Mental Status: She is alert.      LABORATORY DATA:  I have reviewed the data as listed Lab Results  Component Value Date   WBC 4.2 09/16/2022   HGB 11.2 (L) 09/16/2022   HCT 34.6 (L) 09/16/2022   MCV 80.7 09/16/2022   PLT 225 09/16/2022     Chemistry      Component Value Date/Time   NA 140 09/16/2022 0807   K 3.7 09/16/2022 0807   CL 104 09/16/2022 0807   CO2 29 09/16/2022 0807   BUN 19 09/16/2022 0807   CREATININE 0.84 09/16/2022 0807      Component Value Date/Time   CALCIUM 10.1 09/16/2022 0807   ALKPHOS 85 09/16/2022 0807   AST 11 (L) 09/16/2022 0807   ALT 7 09/16/2022 0807   BILITOT 0.5 09/16/2022 0807     Reviewed labs.  RADIOGRAPHIC STUDIES: I have personally reviewed the radiological images as listed and agreed with the findings in the report. No results found.  All questions were answered. The patient knows to call the clinic with any problems, questions or concerns. I spent 20 minutes in the care of this patient including H and P, review of records, counseling and coordination of care.     Rachel Moulds, MD 09/16/2022 9:08 AM

## 2022-09-17 ENCOUNTER — Telehealth: Payer: Self-pay | Admitting: Hematology and Oncology

## 2022-09-17 LAB — KAPPA/LAMBDA LIGHT CHAINS
Kappa free light chain: 21 mg/L — ABNORMAL HIGH (ref 3.3–19.4)
Kappa, lambda light chain ratio: 1.96 — ABNORMAL HIGH (ref 0.26–1.65)
Lambda free light chains: 10.7 mg/L (ref 5.7–26.3)

## 2022-09-17 NOTE — Telephone Encounter (Signed)
Spoke with patient confirming next year appointment  

## 2022-09-18 LAB — IGG 4: IgG, Subclass 4: 39 mg/dL (ref 2–96)

## 2022-09-19 LAB — PROTEIN ELECTROPHORESIS, SERUM
A/G Ratio: 1.3 (ref 0.7–1.7)
Albumin ELP: 3.8 g/dL (ref 2.9–4.4)
Alpha-1-Globulin: 0.3 g/dL (ref 0.0–0.4)
Alpha-2-Globulin: 0.7 g/dL (ref 0.4–1.0)
Beta Globulin: 0.9 g/dL (ref 0.7–1.3)
Gamma Globulin: 1.1 g/dL (ref 0.4–1.8)
Globulin, Total: 3 g/dL (ref 2.2–3.9)
Total Protein ELP: 6.8 g/dL (ref 6.0–8.5)

## 2022-12-26 ENCOUNTER — Ambulatory Visit (HOSPITAL_BASED_OUTPATIENT_CLINIC_OR_DEPARTMENT_OTHER): Payer: Medicare Other | Admitting: Family

## 2023-02-21 ENCOUNTER — Encounter (HOSPITAL_BASED_OUTPATIENT_CLINIC_OR_DEPARTMENT_OTHER): Payer: Self-pay | Admitting: Family

## 2023-02-21 ENCOUNTER — Ambulatory Visit (HOSPITAL_BASED_OUTPATIENT_CLINIC_OR_DEPARTMENT_OTHER): Payer: Medicare Other | Admitting: Family

## 2023-02-21 VITALS — BP 128/76 | HR 90 | Ht 63.0 in | Wt 175.1 lb

## 2023-02-21 DIAGNOSIS — I10 Essential (primary) hypertension: Secondary | ICD-10-CM

## 2023-02-21 DIAGNOSIS — G4733 Obstructive sleep apnea (adult) (pediatric): Secondary | ICD-10-CM | POA: Diagnosis not present

## 2023-02-21 DIAGNOSIS — Z09 Encounter for follow-up examination after completed treatment for conditions other than malignant neoplasm: Secondary | ICD-10-CM

## 2023-02-21 DIAGNOSIS — E782 Mixed hyperlipidemia: Secondary | ICD-10-CM | POA: Diagnosis not present

## 2023-02-21 DIAGNOSIS — I6523 Occlusion and stenosis of bilateral carotid arteries: Secondary | ICD-10-CM | POA: Diagnosis not present

## 2023-02-21 NOTE — Patient Instructions (Addendum)
Medication Instructions:   Continue your current medications.   *If you need a refill on your cardiac medications before your next appointment, please call your pharmacy*   Testing/Procedures: Your physician has requested that you have a carotid duplex. This test is an ultrasound of the carotid arteries in your neck. It looks at blood flow through these arteries that supply the brain with blood. Allow one hour for this exam. There are no restrictions or special instructions.  Your physician has recommended that you have a sleep study. This test records several body functions during sleep, including: brain activity, eye movement, oxygen and carbon dioxide blood levels, heart rate and rhythm, breathing rate and rhythm, the flow of air through your mouth and nose, snoring, body muscle movements, and chest and belly movement.   Follow-Up: At [facility], you and your health needs are our priority.  As part of our continuing mission to provide you with exceptional heart care, we have created designated Provider Care Teams.  These Care Teams include your primary Cardiologist (physician) and Advanced Practice Providers (APPs -  Physician Assistants and Nurse Practitioners) who all work together to provide you with the care you need, when you need it.  We recommend signing up for the patient portal called "MyChart".  Sign up information is provided on this After Visit Summary.  MyChart is used to connect with patients for Virtual Visits (Telemedicine).  Patients are able to view lab/test results, encounter notes, upcoming appointments, etc.  Non-urgent messages can be sent to your provider as well.   To learn more about what you can do with MyChart, go to [email or web address].    Your next appointment:   6 month(s)  Provider:   Chilton Si, MD    Other Instructions
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
## 2023-02-24 ENCOUNTER — Telehealth: Payer: Self-pay

## 2023-02-24 NOTE — Telephone Encounter (Signed)
**Note De-Identified Clark Clowdus Obfuscation** Ordering provider: Gillian Shields, NP Associated diagnoses: OSA-G47.33, Fatigue-R53. 83 and Syncope-R55 Home Sleep Study PA obtained on 02/24/2023 by Bobetta Korf, Lorelle Formosa, LPN. Decision ID #: [id number] -Authorization/Notification is not required for the requested service(s) per the [facility] Portal  Order transferred to the [facility] workqueue

## 2023-02-25 ENCOUNTER — Telehealth: Payer: Self-pay

## 2023-02-25 NOTE — Telephone Encounter (Signed)
Called [facility] program referral, explained PREP, she would like to attend next class at [facility] on October 14, every M/W 12:30-1:45; assessment visit scheduled for 10/14 at 11:45

## 2023-03-03 NOTE — Progress Notes (Signed)
[facility] PREP Evaluation  Patient Details  Name: Donna Wu MRN: [id number] Date of Birth: [birth year] Age: 77 y.o. PCP: Mila Palmer, MD  Vitals:   03/03/23 1440  BP: 138/72  Pulse: 88  SpO2: 98%  Weight: 173 lb 9.6 oz (78.7 kg)     [facility] Eval - 03/03/23 1400       [facility] "PREP" Location   [facility] "PREP" Location [facility]      Referral    Referring Provider Walker    Reason for referral Diabetes;Hypertension;Inactivity;Orthopedic    Program Start Date 03/03/23      Measurement   Waist Circumference 39.5 inches    Hip Circumference 47.5 inches    Body fat 44.3 percent      Information for Trainer   Goals --   BP goal: 120/80 or less; establish exercise routine/program   Current Exercise none    Orthopedic Concerns --   s/p L TKA, OA bilat hips and R knee   Pertinent Medical History --   HTN, pre-diabetes, OSA   Medications that affect exercise Beta blocker      Timed Up and Go (TUGS)   Timed Up and Go Low risk <9 seconds      Mobility and Daily Activities   I find it easy to walk up or down two or more flights of stairs. 2    I have no trouble taking out the trash. 2    I do housework such as vacuuming and dusting on my own without difficulty. 2    I can easily lift a gallon of milk (8lbs). 4    I can easily walk a mile. 1    I have no trouble reaching into high cupboards or reaching down to pick up something from the floor. 2    I do not have trouble doing out-door work such as Loss adjuster, chartered, raking leaves, or gardening. 1      Mobility and Daily Activities   I feel younger than my age. 3    I feel independent. 4    I feel energetic. 2    I live an active life.  2    I feel strong. 2    I feel healthy. 2    I feel active as other people my age. 2      How fit and strong are you.   Fit and Strong Total Score 31            Past Medical History:  Diagnosis Date   Anxiety    Arthritis    Bronchitis    hx of   Carotid stenosis 12/24/2021    Cataracts, bilateral    Colon polyps    Diabetes mellitus without complication (HCC)    on oral medicine   GERD (gastroesophageal reflux disease)    H/O hiatal hernia    Hyperlipidemia    Hypertension    Dr. Johnn Hai    Pneumonia    "as a baby"   Past Surgical History:  Procedure Laterality Date   ABDOMINAL HYSTERECTOMY     BREAST BIOPSY Right    No Scar visible    BREAST LUMPECTOMY  app. 40 years ago   BREAST SURGERY     CARDIOVASCULAR STRESS TEST     done approx. 7 years ago   ESOPHAGOGASTRODUODENOSCOPY ENDOSCOPY     TOTAL KNEE ARTHROPLASTY  03/31/2012   Procedure: TOTAL KNEE ARTHROPLASTY;  Surgeon: Cammy Copa, MD;  Location: [facility] OR;  Service: Orthopedics;  Laterality: Left;  Left total knee arthroplasty   TUBAL LIGATION     Social History   Tobacco Use  Smoking Status Never  Smokeless Tobacco Never  To begin PREP class today at [facility], every M/W 12:30-1:45  Donna Wu Donna Wu Donna Wu 03/03/2023, 2:45 PM

## 2023-03-03 NOTE — Progress Notes (Signed)
Welcome to class; introductions, review of notebook, tour of facility

## 2023-03-10 NOTE — Progress Notes (Signed)
[facility] PREP Weekly Session  Patient Details  Name: Donna Wu MRN: [id number] Date of Birth: 10-19-1945 Age: 77 y.o. PCP: Mila Palmer, MD  Vitals:   03/10/23 1400  Weight: 174 lb 9.6 oz (79.2 kg)     [facility] Weekly seesion - 03/10/23 1400       [facility] "PREP" Location   [facility] "PREP" Location [facility]      Weekly Session   Topic Discussed Importance of resistance training;Other ways to be active   Goals: work up to 150 minutes/wk of cardio, strength training twice/wk, work up to 3 times/wk for 20-40 minutes; sitting no longer than 30 minutes during the day.   Classes attended to date 3             Ketrina Boateng B Saara Kijowski 03/10/2023, 2:01 PM

## 2023-03-11 ENCOUNTER — Ambulatory Visit (HOSPITAL_BASED_OUTPATIENT_CLINIC_OR_DEPARTMENT_OTHER): Payer: Medicare Other

## 2023-03-11 DIAGNOSIS — I6523 Occlusion and stenosis of bilateral carotid arteries: Secondary | ICD-10-CM

## 2023-03-13 ENCOUNTER — Telehealth (HOSPITAL_BASED_OUTPATIENT_CLINIC_OR_DEPARTMENT_OTHER): Payer: Self-pay

## 2023-03-13 DIAGNOSIS — I6523 Occlusion and stenosis of bilateral carotid arteries: Secondary | ICD-10-CM

## 2023-03-13 NOTE — Telephone Encounter (Addendum)
Results called to patient who verbalizes understanding! Repeat testing ordered!    ----- Message from Alver Sorrow sent at 03/13/2023  7:52 AM EDT ----- Mild bilateral 1-39% stenosis. Stable from previous. Repeat duplex in one year.

## 2023-03-14 DIAGNOSIS — E1169 Type 2 diabetes mellitus with other specified complication: Secondary | ICD-10-CM | POA: Diagnosis not present

## 2023-03-17 NOTE — Progress Notes (Signed)
[facility] PREP Weekly Session  Patient Details  Name: Donna Wu MRN: [id number] Date of Birth: 1945/08/08 Age: 77 y.o. PCP: Mila Palmer, MD  Vitals:   03/17/23 1632  Weight: 170 lb (77.1 kg)     [facility] Weekly seesion - 03/17/23 1600       [facility] "PREP" Location   [facility] "PREP" Location [facility]      Weekly Session   Topic Discussed Healthy eating tips   Foods to reduce, foods to increase; eat the rainbow of colors; introduced to Donna Wu app   Minutes exercised this week 45 minutes    Classes attended to date 4             Donna Wu Donna Wu 03/17/2023, 4:32 PM

## 2023-03-24 NOTE — Progress Notes (Signed)
[facility] PRE[facility] Weekly Session  Patient Details  Name: Donna Wu MRN: [id number] Date of Birth: [birth year] Age: 77 y.o. PCP: Mila Palmer, MD  Vitals:   03/24/23 1356  Weight: 172 lb (78 kg)     [facility] Weekly seesion - 03/24/23 1300       [facility] "PREP" Location   [facility] "PREP" Location [facility]      Weekly Session   Topic Discussed Health habits   Sugar demo: limit added sugars to 24gm for women, 36 gm for men   Classes attended to date 6             Darby Shadwick B Arminda Foglio 03/24/2023, 1:56 PM

## 2023-03-28 ENCOUNTER — Other Ambulatory Visit: Payer: Self-pay | Admitting: Family Medicine

## 2023-03-28 DIAGNOSIS — Z1231 Encounter for screening mammogram for malignant neoplasm of breast: Secondary | ICD-10-CM

## 2023-03-31 NOTE — Progress Notes (Signed)
[facility] PRE[facility] Weekly Session  Patient Details  Name: Donna Wu MRN: [id number] Date of Birth: 11-03-1945 Age: 77 y.o. PCP: Mila Palmer, MD  Vitals:   03/31/23 1417  Weight: 169 lb 8 oz (76.9 kg)     [facility] Weekly seesion - 03/31/23 1400       [facility] "PREP[facility] Location   [facility]PREP[facility] Location [facility]      Weekly Session   Topic Discussed Restaurant Eating   Salt demo: limit intake to 1500-2300 mg/day   Minutes exercised this week 15 minutes    Classes attended to date 8             Donna Wu B Ahyan Kreeger 03/31/2023, 2:17 PM

## 2023-04-01 ENCOUNTER — Ambulatory Visit (HOSPITAL_BASED_OUTPATIENT_CLINIC_OR_DEPARTMENT_OTHER): Payer: Medicare Other | Attending: Family | Admitting: Cardiovascular Disease

## 2023-04-01 DIAGNOSIS — G4733 Obstructive sleep apnea (adult) (pediatric): Secondary | ICD-10-CM | POA: Diagnosis not present

## 2023-04-07 NOTE — Progress Notes (Signed)
[facility] PRE[facility] Weekly Session  Patient Details  Name: Donna Wu MRN: [id number] Date of Birth: 05/12/1946 Age: 77 y.o. PCP: Mila Palmer, MD  Vitals:   04/07/23 1334  Weight: 172 lb (78 kg)     [facility] Weekly seesion - 04/07/23 1300       [facility]PREP[facility] Location   [facility]PREP[facility] Location [facility]      Weekly Session   Topic Discussed Water;Other   Managing Holiday Eating   Minutes exercised this week 130 minutes    Classes attended to date 53             Aeron Lheureux B Lyah Millirons 04/07/2023, 1:34 PM

## 2023-04-21 NOTE — Progress Notes (Signed)
[facility] PRE[facility] Weekly Session  Patient Details  Name: Donna Wu MRN: [id number] Date of Birth: August 01, 1945 Age: 77 y.o. PCP: Mila Palmer, MD  Vitals:   04/21/23 1336  Weight: 172 lb (78 kg)     [facility] Weekly seesion - 04/21/23 1300       [facility]PREP[facility] Location   [facility]PREP[facility] Location [facility]      Weekly Session   Topic Discussed Expectations and non-scale victories   Halfway through program, review/revisit/re-state goals; Staying positive   Classes attended to date 10             Dalores Weger B Clarisa Danser 04/21/2023, 1:37 PM

## 2023-04-23 ENCOUNTER — Encounter (HOSPITAL_BASED_OUTPATIENT_CLINIC_OR_DEPARTMENT_OTHER): Payer: Self-pay | Admitting: Cardiovascular Disease

## 2023-04-23 NOTE — Procedures (Signed)
      Patient Name: Donna Wu, Donna Wu Date: 04/01/2023 Gender: Female D.O.B: [birth year] Age (years): 61 Referring Provider: Alver Sorrow NP Height (inches): 63 Interpreting Physician: Nicki Guadalajara MD, ABSM Weight (lbs): 172 RPSGT: Ross Sink BMI: 30 MRN: [id number] Neck Size: <br>  CLINICAL INFORMATION Sleep Study Type: HST  Indication for sleep study: OSA,   Epworth Sleepiness Score: 7  SLEEP STUDY TECHNIQUE A multi-channel overnight portable sleep study was performed. The channels recorded were: nasal airflow, thoracic respiratory movement, and oxygen saturation with a pulse oximetry. Snoring was also monitored.  MEDICATIONS acetaminophen (TYLENOL 8 HOUR) 650 MG CR tablet aspirin 81 MG chewable tablet atorvastatin (LIPITOR) 20 MG tablet benazepril (LOTENSIN) 10 MG tablet Cholecalciferol (VITAMIN D) 50 MCG (2000 UT) tablet diclofenac Sodium (VOLTAREN) 1 % GEL EQ GENTLE LAXATIVE 5 MG EC tablet famotidine (PEPCID) 40 MG tablet ferrous sulfate 325 (65 FE) MG tablet hydrochlorothiazide (HYDRODIURIL) 25 MG tablet metoprolol succinate (TOPROL-XL) 25 MG 24 hr tablet omeprazole (PRILOSEC) 40 MG capsule Oyster Shell Calcium 500 MG TABS sertraline (ZOLOFT) 50 MG tablet vitamin B-12 (CYANOCOBALAMIN) 500 MCG tablet Patient self-administered medications include: N/A.  SLEEP ARCHITECTURE Patient was studied for 366.5 minutes. The sleep efficiency was 100.0 % and the patient was supine for 0%. The arousal index was 0.0 per hour.  RESPIRATORY PARAMETERS The overall AHI was 26.5 per hour, with a central apnea index of 0 per hour.  The oxygen nadir was 89% during sleep.  CARDIAC DATA Mean heart rate during sleep was 74.1 bpm.  IMPRESSIONS - Moderate obstructive sleep apnea occurred during this study (AHI 26.5/h). Sleep apnea was severe with supine sleep (AHI 31.9/h). - Mild oxygen desaturation was noted during this study (Min O2 89%). - Patient snored  3.5% during the sleep.  DIAGNOSIS - Obstructive Sleep Apnea (G47.33)  RECOMMENDATIONS - Recommend therapeutic CPAP for treatment of her sleep disordered breathing. Initiate a trial of Auto - PAP with EPR of 3 at 7 - 18 cm of water. - Effort should be made to optimize nasal and oropharyngeal patency. - Avoid alcohol, sedatives and other CNS depressants that may worsen sleep apnea and disrupt normal sleep architecture. - Sleep hygiene should be reviewed to assess factors that may improve sleep quality. - Weight management and regular exercise should be initiated or continued. - Recommend a download and sleep clinic evauation after 6 - 8 weeks of therapy.   [Electronically signed] 04/23/2023 05:57 PM  Nicki Guadalajara MD, [facility], ABSM Diplomate, American Board of Sleep Medicine NPI: [id number]  [facility] PH: [phone]   FX: [phone] ACCREDITED BY THE AMERICAN ACADEMY OF SLEEP MEDICINE

## 2023-04-28 NOTE — Progress Notes (Signed)
[facility] PRE[facility] Weekly Session  Patient Details  Name: Donna Wu MRN: [id number] Date of Birth: [birth year] Age: 77 y.o. PCP: Mila Palmer, MD  Vitals:   04/28/23 1331  Weight: 168 lb 3.2 oz (76.3 kg)     [facility] Weekly seesion - 04/28/23 1300       [facility] "PREP" Location   [facility]PREP[facility] Location [facility]      Weekly Session   Topic Discussed Other   Portion control; visualize your portion size demo; review of [employer] nutrition/food label   Minutes exercised this week 95 minutes    Classes attended to date 16             Ermal Brzozowski B Charly Holcomb 04/28/2023, 1:32 PM

## 2023-05-02 ENCOUNTER — Ambulatory Visit
Admission: RE | Admit: 2023-05-02 | Discharge: 2023-05-02 | Disposition: A | Discharge status: Home / Self Care | Payer: Medicare Other | Source: Ambulatory Visit | Attending: Family Medicine | Admitting: Family Medicine

## 2023-05-02 DIAGNOSIS — Z1231 Encounter for screening mammogram for malignant neoplasm of breast: Secondary | ICD-10-CM

## 2023-05-05 NOTE — Progress Notes (Signed)
[facility] PREP Weekly Session  Patient Details  Name: Donna Wu MRN: [id number] Date of Birth: [birth year] Age: 77 y.o. PCP: Mila Palmer, MD  Vitals:   05/05/23 1349  Weight: 170 lb 12.8 oz (77.5 kg)     [facility] Weekly seesion - 05/05/23 1300       [facility] "PREP" Location   [facility] "PREP[facility] Location [facility]      Weekly Session   Topic Discussed Finding support   Membership talk with Weston Brass; Emmi programming educational information after program ends   Minutes exercised this week 35 minutes    Classes attended to date 80             Yisell Sprunger B Camdan Burdi 05/05/2023, 1:50 PM

## 2023-05-12 NOTE — Progress Notes (Signed)
[facility] PREP Weekly Session  Patient Details  Name: Donna Wu MRN: [id number] Date of Birth: 11/21/1945 Age: 77 y.o. PCP: Mila Palmer, MD  Vitals:   05/12/23 1342  Weight: 171 lb 9.6 oz (77.8 kg)     [facility] Weekly seesion - 05/12/23 1300       [facility] "PREP" Location   [facility] "PREP[facility] Location [facility]      Weekly Session   Topic Discussed Calorie breakdown   Carbs, proteins and fats; diff between simple and complex carbs; supplements, quality of macronutrients   Minutes exercised this week 30 minutes    Classes attended to date 17             Darris Carachure B Tareq Dwan 05/12/2023, 1:44 PM

## 2023-05-26 NOTE — Progress Notes (Signed)
 [facility] Weekly Session  Patient Details  Name: Donna Wu MRN: [id number] Date of Birth: March 29, 1946 Age: 78 y.o. PCP: Verena Mems, MD  Vitals:   05/26/23 1341  Weight: 164 lb 9.6 oz (74.7 kg)     [facility] Weekly seesion - 05/26/23 1300       [facility] PREP Location   [facility] PREP Location [facility]      Weekly Session   Topic Discussed Hitting roadblocks   Fit testing completed; review of goals and activity plan for next 90 days; to bring that form and PREP survey to final assessment visit next Monday 1/13   Minutes exercised this week 40 minutes    Classes attended to date 1             Raiquan Chandler B Charly Holcomb 05/26/2023, 1:43 PM

## 2023-06-02 NOTE — Progress Notes (Signed)
 [facility] PRE[facility] Evaluation  Patient Details  Name: VIRGILIA QUIGG MRN: [id number] Date of Birth: [birth year] Age: 78 y.o. PCP: Verena Mems, MD  Vitals:   06/02/23 1244  BP: 132/64  Pulse: 92  SpO2: 97%  Weight: 171 lb 3.2 oz (77.7 kg)     [facility] Eval - 06/02/23 1200       [facility] PREP Location   [facility] PREP Location [facility]      Referral    Program Start Date 03/03/23    Program End Date 06/02/23      Measurement   Waist Circumference 39.5 inches    Waist Circumference End Program 38.5 inches    Hip Circumference 47.5 inches    Hip Circumference End Program 47 inches    Body fat 44.1 percent      Mobility and Daily Activities   I find it easy to walk up or down two or more flights of stairs. 2    I have no trouble taking out the trash. 3    I do housework such as vacuuming and dusting on my own without difficulty. 4    I can easily lift a gallon of milk (8lbs). 3    I can easily walk a mile. 2    I have no trouble reaching into high cupboards or reaching down to pick up something from the floor. 2    I do not have trouble doing out-door work such as loss adjuster, chartered, raking leaves, or gardening. 1      Mobility and Daily Activities   I feel younger than my age. 3    I feel independent. 3    I feel energetic. 2    I live an active life.  3    I feel strong. 2    I feel healthy. 2    I feel active as other people my age. 2      How fit and strong are you.   Fit and Strong Total Score 34            Past Medical History:  Diagnosis Date   Anxiety    Arthritis    Bronchitis    hx of   Carotid stenosis 12/24/2021   Cataracts, bilateral    Colon polyps    Diabetes mellitus without complication (HCC)    on oral medicine   GERD (gastroesophageal reflux disease)    H/O hiatal hernia    Hyperlipidemia    Hypertension    Dr. Mems Bucco    Pneumonia    as a baby   Past Surgical History:  Procedure Laterality Date   ABDOMINAL HYSTERECTOMY      BREAST BIOPSY Right    No Scar visible    BREAST LUMPECTOMY  app. 40 years ago   BREAST SURGERY     CARDIOVASCULAR STRESS TEST     done approx. 7 years ago   ESOPHAGOGASTRODUODENOSCOPY ENDOSCOPY     TOTAL KNEE ARTHROPLASTY  03/31/2012   Procedure: TOTAL KNEE ARTHROPLASTY;  Surgeon: Cordella Glendia Hutchinson, MD;  Location: [facility] OR;  Service: Orthopedics;  Laterality: Left;  Left total knee arthroplasty   TUBAL LIGATION     Social History   Tobacco Use  Smoking Status Never  Smokeless Tobacco Never  Wt loss: 2.4 pounds Inches lost: 1.5 How fit and strong survey: 03/03/23: 31 05/29/23: 34 Fit testing:  Sit to Stands improved from 13 to 15 Bicep curls improved from 18 to 23 Education sessions completed:  11 Workout sessions completed: 7   Angelize Ryce B Kimberly Coye 06/02/2023, 12:46 PM

## 2023-08-28 DIAGNOSIS — H524 Presbyopia: Secondary | ICD-10-CM | POA: Diagnosis not present

## 2023-08-28 DIAGNOSIS — H2513 Age-related nuclear cataract, bilateral: Secondary | ICD-10-CM | POA: Diagnosis not present

## 2023-08-30 IMAGING — CT CT ABD-PELV W/ CM
2 of 5 series · 12 of 36 positions shown, 15 images · IV contrast (agent unspecified)
Comparison: None.

CLINICAL DATA: Syncope.  Trauma.

EXAM:
CT CHEST, ABDOMEN, AND PELVIS WITH CONTRAST
TECHNIQUE: Multidetector CT imaging of the chest, abdomen and pelvis was
performed following the standard protocol during bolus
administration of intravenous contrast.

[Series 2: cap with · axial · 0.78mm/px · z∈[-515,-75]mm · 9 of 111 slices shown, 12 images]
[im 12/111  mediastinal]
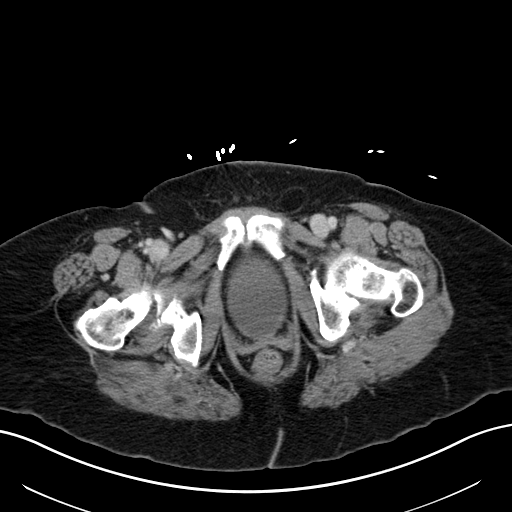
[im 12/111  lung]
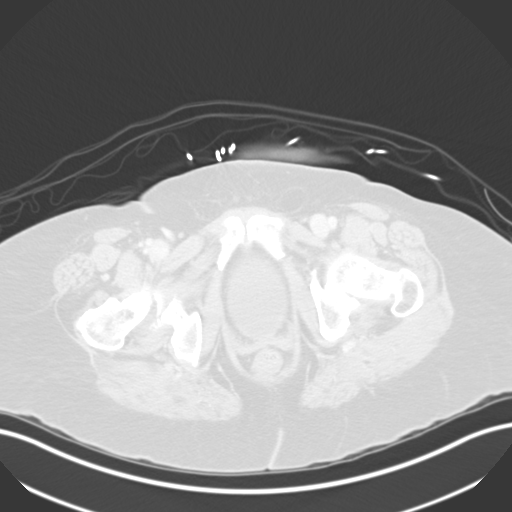
[im 23/111  lung]
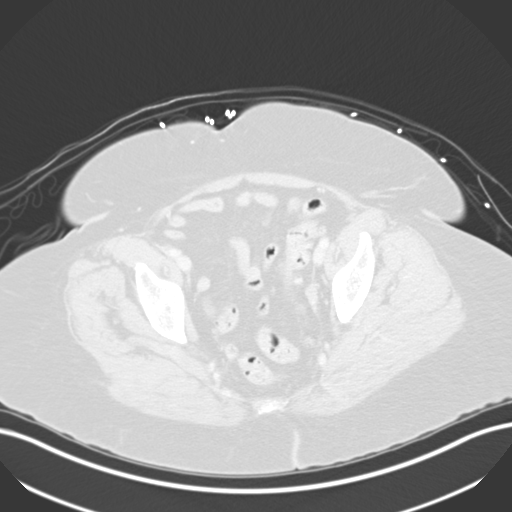
[im 34/111  lung]
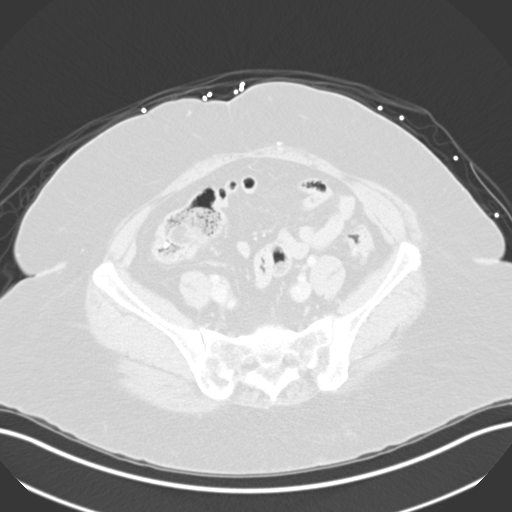
[im 45/111  lung]
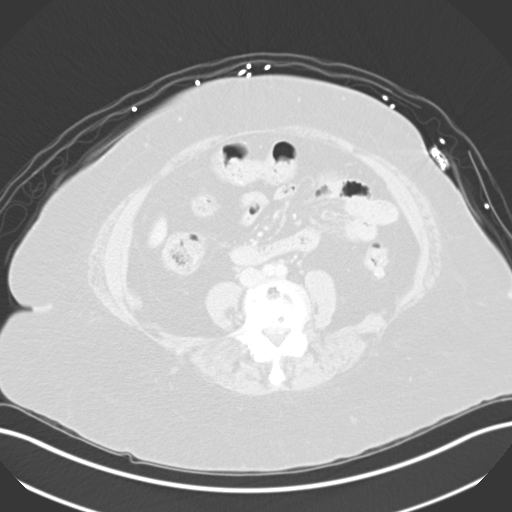
[im 56/111  mediastinal]
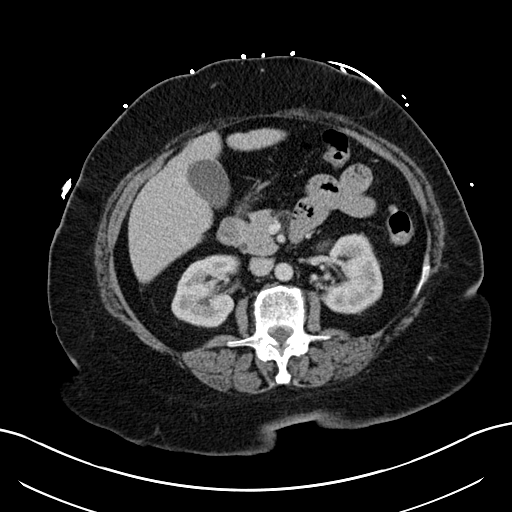
[im 56/111  lung]
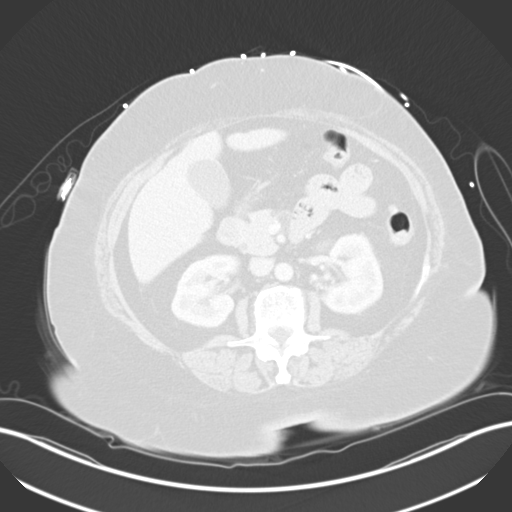
[im 67/111  lung]
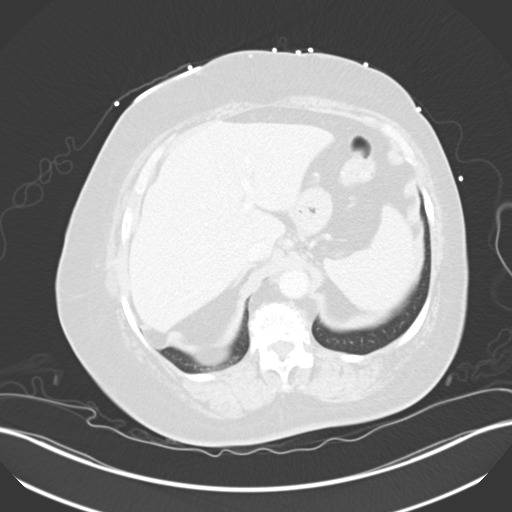
[im 78/111  lung]
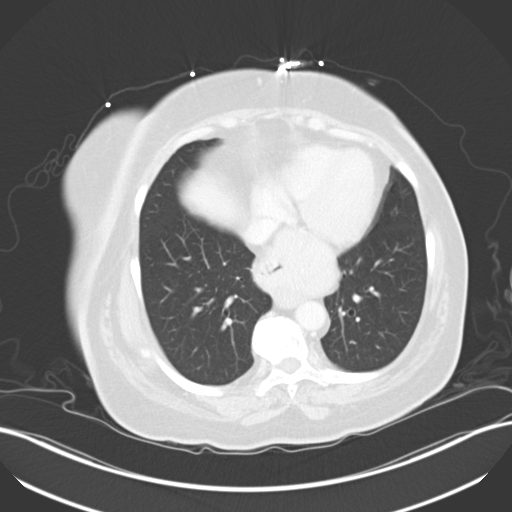
[im 89/111  lung]
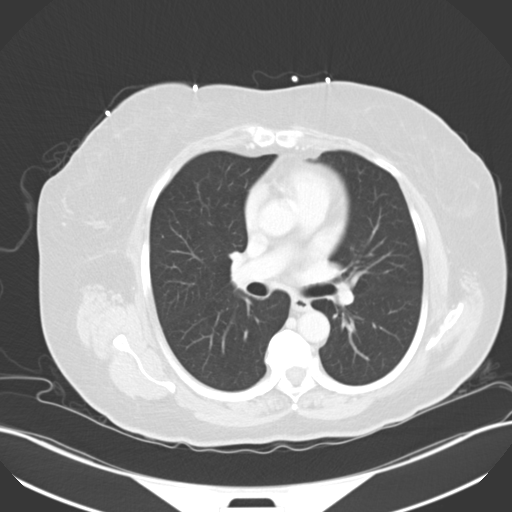
[im 100/111  mediastinal]
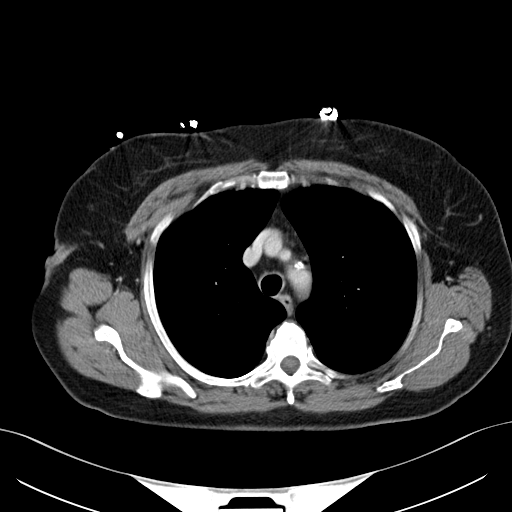
[im 100/111  lung]
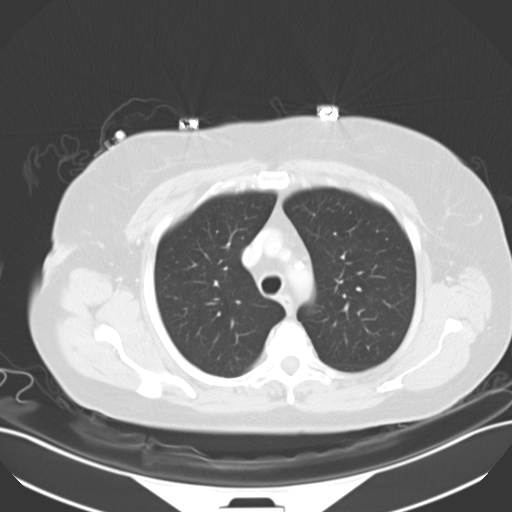

[Series 5: coronals · coronal · 0.78mm/px · 3 of 157 slices shown]
[im 32/157  lung]
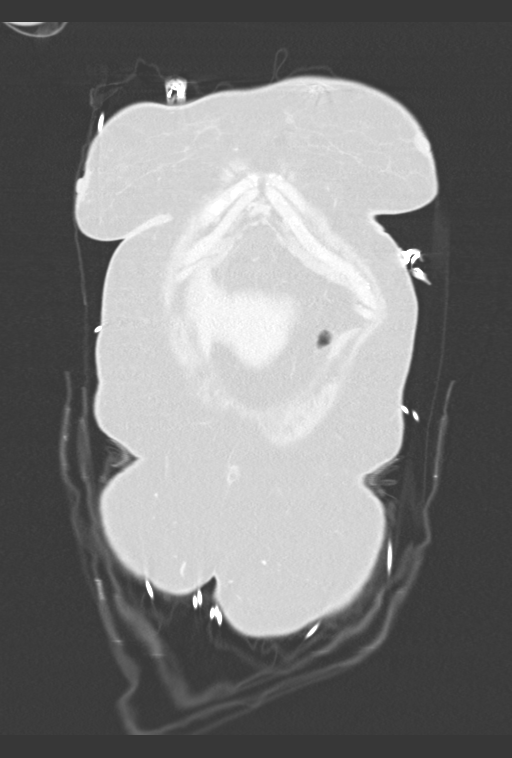
[im 63/157  lung]
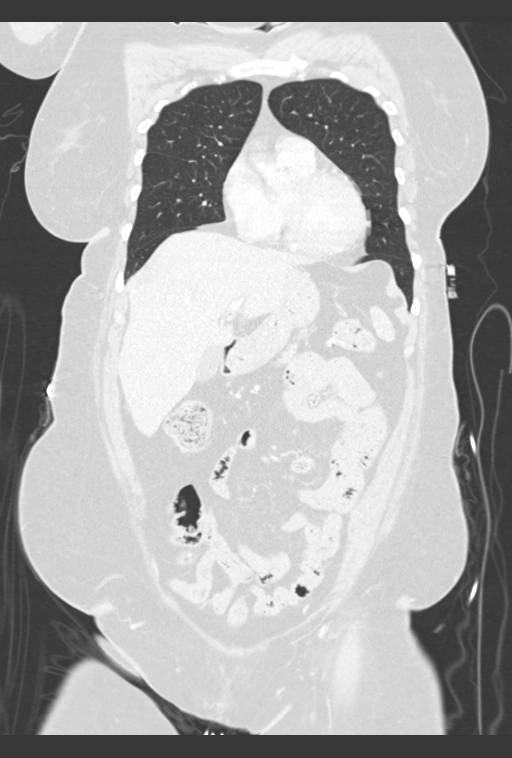
[im 94/157  lung]
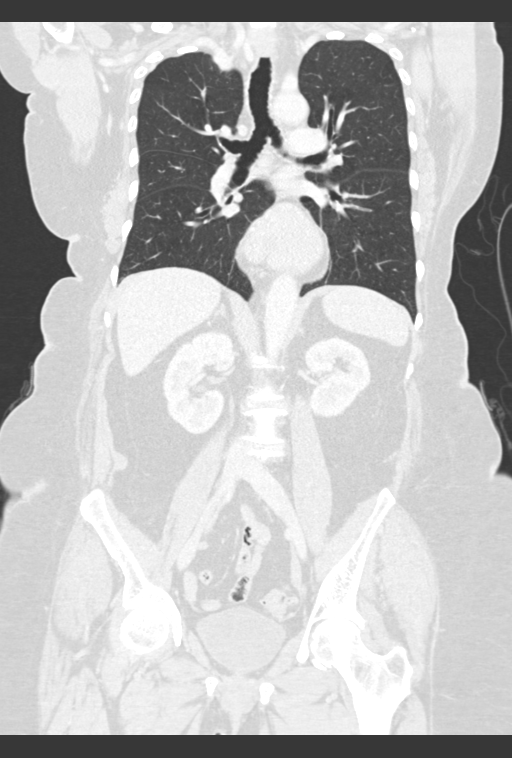

[12 of 36 positions shown; findings below may reference images not displayed]

RADIATION DOSE REDUCTION: This exam was performed according to the
departmental dose-optimization program which includes automated
exposure control, adjustment of the mA and/or kV according to
patient size and/or use of iterative reconstruction technique.

CONTRAST:  75mL OMNIPAQUE IOHEXOL 300 MG/ML  SOLN
FINDINGS: CHEST:
Ports and Devices: None.

Lungs/airways:

No focal consolidation. Triangular pulmonary micronodule likely
representing a an intrapulmonary lymph node ([DATE]). Subpleural
pulmonary micronodule ([DATE]). No pulmonary mass. No pulmonary
contusion or laceration. No pneumatocele formation.

The central airways are patent.

Pleura: No pleural effusion. No pneumothorax. No hemothorax.

Lymph Nodes: No mediastinal, hilar, or axillary lymphadenopathy.

Mediastinum:

No pneumomediastinum. No aortic injury or mediastinal hematoma.

The thoracic aorta is normal in caliber. Mild atherosclerotic
plaque. The heart is normal in size. No significant pericardial
effusion.

The esophagus is unremarkable. Moderate to large volume hiatal
hernia.

The thyroid is unremarkable.

Chest Wall / Breasts: No chest wall mass.

Musculoskeletal: No acute rib or sternal fracture. No spinal
fracture. At least multilevel moderate degenerative changes of the
spine. At least mild to moderate degenerative changes of the right
shoulder.

ABDOMEN / PELVIS:
Liver: Not enlarged. Subcentimeter hypodensity too small to
characterize. Otherwise no focal lesion. No laceration or
subcapsular hematoma.

Biliary System: The gallbladder is otherwise unremarkable with no
radio-opaque gallstones. No biliary ductal dilatation.

Pancreas: Normal pancreatic contour. No main pancreatic duct
dilatation.

Spleen: Not enlarged. No focal lesion. No laceration, subcapsular
hematoma, or vascular injury.

Adrenal Glands: No nodularity bilaterally.

Kidneys:

Bilateral kidneys enhance symmetrically. No hydronephrosis. No
contusion, laceration, or subcapsular hematoma.

No injury to the vascular structures or collecting systems. No
hydroureter.

The urinary bladder is unremarkable.

On delayed imaging, there is no urothelial wall thickening and there
are no filling defects in the opacified portions of the bilateral
collecting systems or ureters.

Bowel: No small or large bowel wall thickening or dilatation.
Scattered colonic diverticulosis. The appendix measures at the upper
limits of normal in caliber with no associated periappendiceal fat
stranding. Appendicolith noted within the appendiceal lumen.

Mesentery, Omentum, and Peritoneum: No simple free fluid ascites. No
pneumoperitoneum. No hemoperitoneum. No mesenteric hematoma
identified. No organized fluid collection.

Pelvic Organs: Status post hysterectomy.  No adnexal lesion.

Lymph Nodes: No abdominal, pelvic, inguinal lymphadenopathy.

Vasculature: No abdominal aorta or iliac aneurysm. No active
contrast extravasation or pseudoaneurysm.

Musculoskeletal:

No significant soft tissue hematoma. Query trace fat containing left
inguinal hernia.

No acute pelvic fracture. No spinal fracture. Multilevel moderate
severe degenerative changes of the spine. Moderate to severe
degenerative changes of the right hip.
IMPRESSION: 1. No acute traumatic injury to the chest, abdomen, or pelvis.

2. No acute fracture or traumatic malalignment of the thoracic or
lumbar spine.

Other imaging findings of potential clinical significance

1. Moderate to large volume hiatal hernia. Moderate to severe
degenerative changes of the right hip.
2. Couple scattered pulmonary micronodules. No follow-up needed if
patient is low-risk (and has no known or suspected primary
neoplasm). Non-contrast chest CT can be considered in 12 months if
patient is high-risk. This recommendation follows the consensus
statement: Guidelines for Management of Incidental Pulmonary Nodules
Detected on CT Images: From the [HOSPITAL] 0447; Radiology
3.  Aortic Atherosclerosis ([id number]).

## 2023-08-30 IMAGING — CT CT CERVICAL SPINE W/O CM
3 of 4 series · 9 of 35 positions shown, 10 images · non-contrast
Comparison: None.

CLINICAL DATA: Trauma



[Series 6: orthogonal bone · axial · 0.21mm/px · z∈[-234,-234]mm · 1 of 118 slices shown, 2 images]
[im 67/118  soft-tissue]
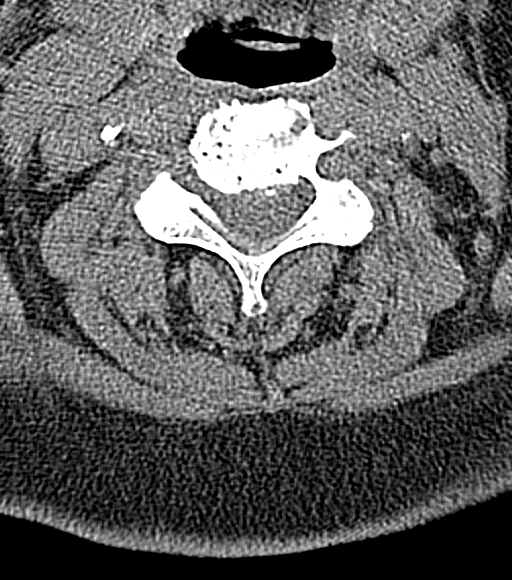
[im 67/118  bone]
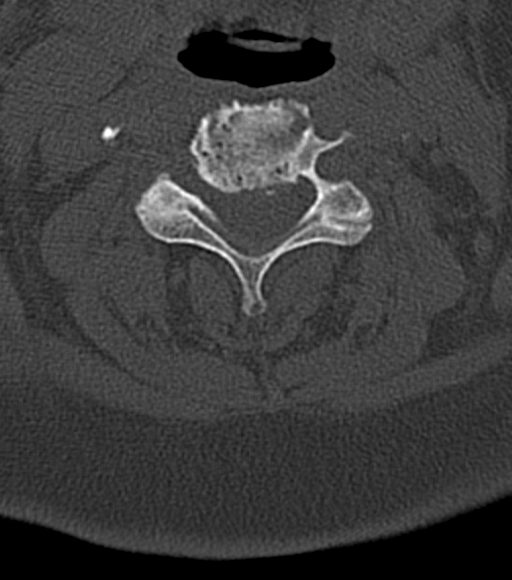

[Series 7: coronal bone · coronal · 0.21mm/px · 3 of 61 slices shown]
[im 13/61  bone]
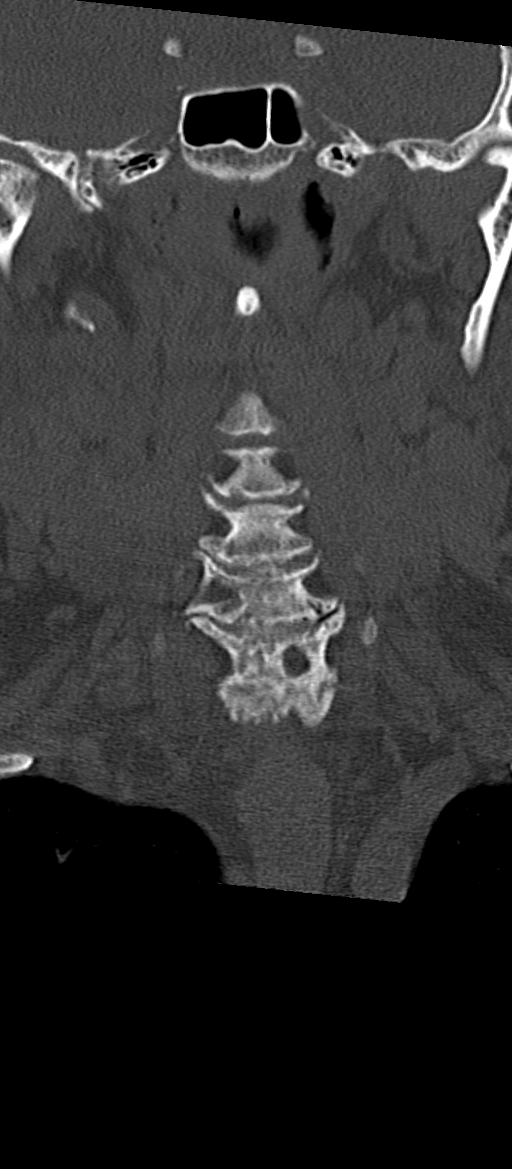
[im 25/61  bone]
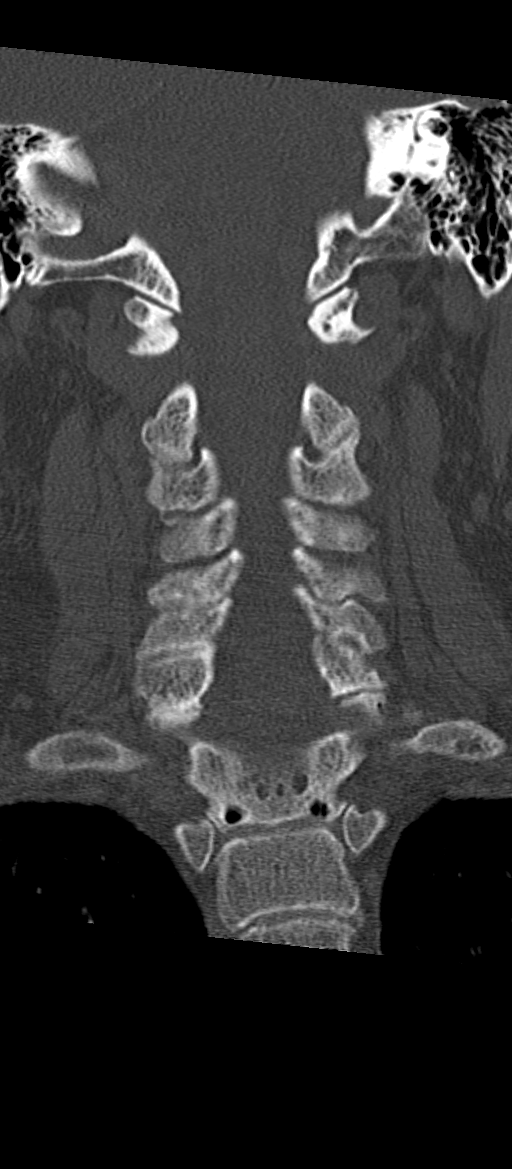
[im 36/61  bone]
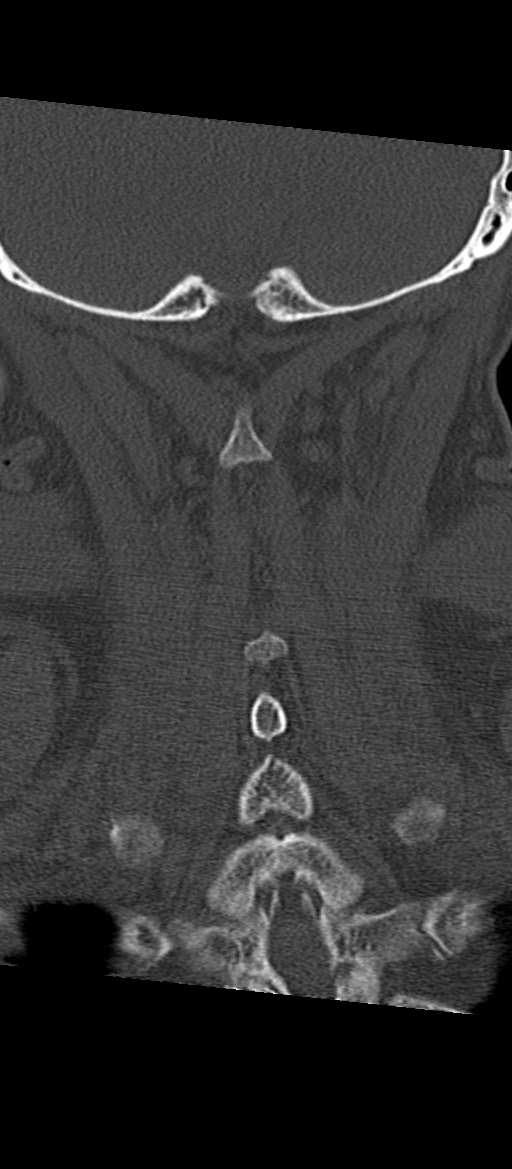

[Series 8: sagittal bone · sagittal · 0.23mm/px · 5 of 54 slices shown]
[im 18/54  bone]
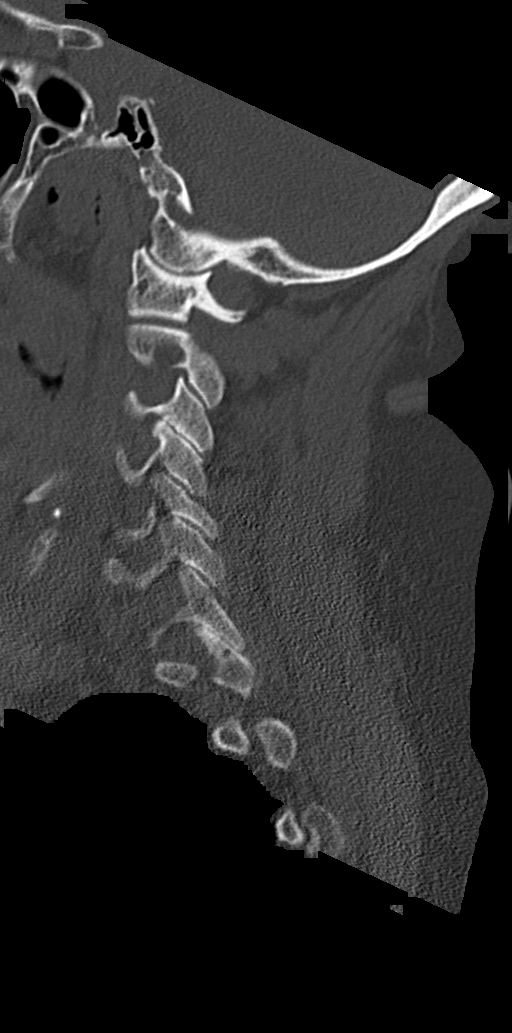
[im 23/54  bone]
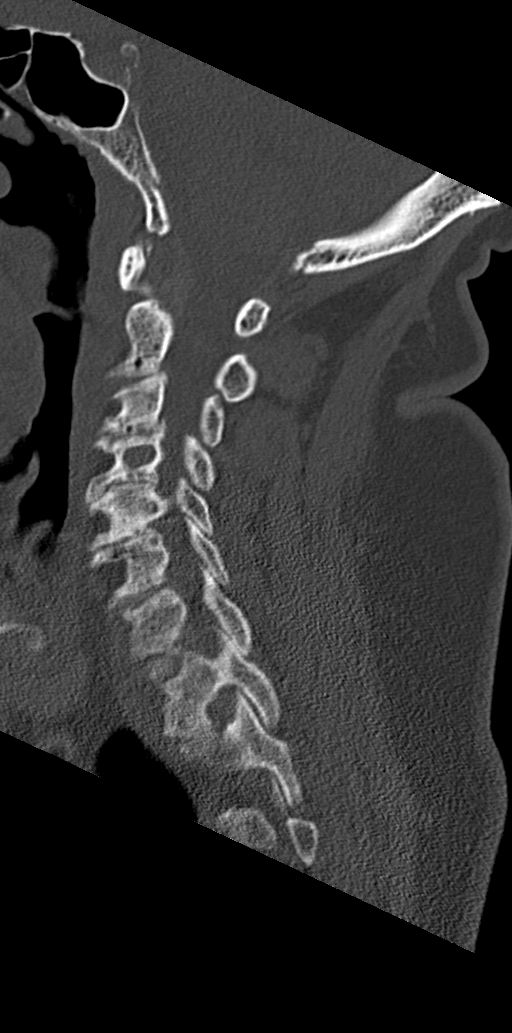
[im 27/54  bone]
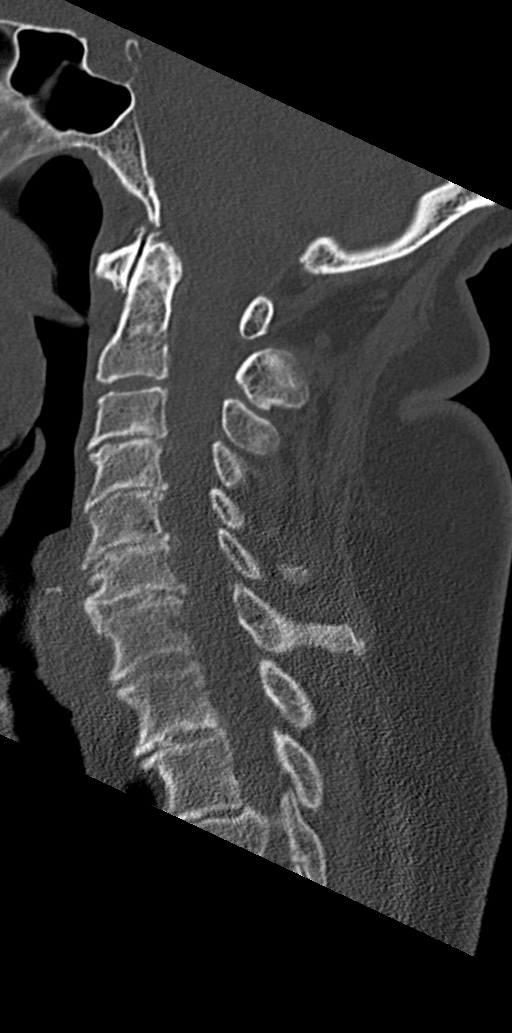
[im 31/54  bone]
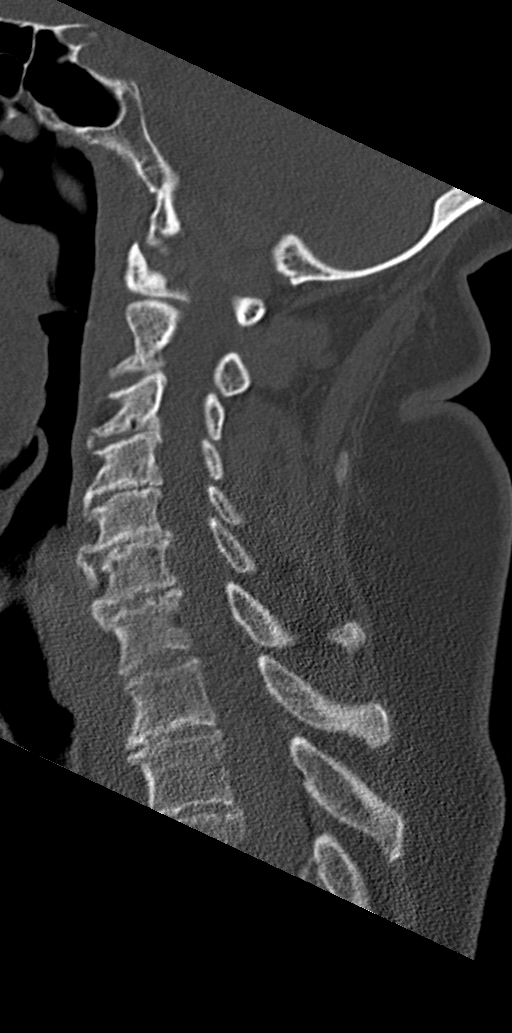
[im 36/54  bone]
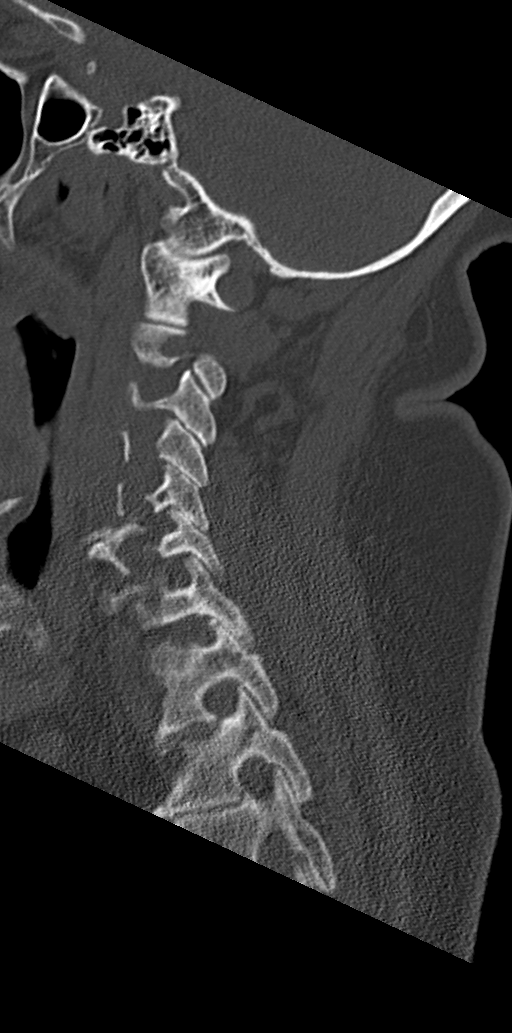

[9 of 35 positions shown; findings below may reference images not displayed]

FINDINGS: CT HEAD FINDINGS

Brain: No acute territorial infarction, hemorrhage or intracranial
mass. Mild patchy white matter hypodensity likely chronic small
vessel ischemic change. Nonenlarged ventricles

Vascular: No hyperdense vessels.  No unexpected calcification

Skull: Normal. Negative for fracture or focal lesion.

Sinuses/Orbits: No acute finding.

Other: None

CT CERVICAL SPINE FINDINGS

Alignment: No subluxation.  Facet alignment within normal limits.

Skull base and vertebrae: No acute fracture. No primary bone lesion
or focal pathologic process.

Soft tissues and spinal canal: No prevertebral fluid or swelling. No
visible canal hematoma.

Disc levels: Advanced diffuse degenerative changes C3 through T1
with disc space narrowing and osteophyte. Facet degenerative changes
at multiple levels with foraminal narrowing

Upper chest: Negative.

Other: None
IMPRESSION: 1. No CT evidence for acute intracranial abnormality. Mild chronic
small vessel ischemic changes of the white matter
2. Straightening of the cervical spine with multilevel degenerative
change. No acute osseous abnormality

## 2023-09-09 DIAGNOSIS — E1169 Type 2 diabetes mellitus with other specified complication: Secondary | ICD-10-CM | POA: Diagnosis not present

## 2023-09-09 DIAGNOSIS — D509 Iron deficiency anemia, unspecified: Secondary | ICD-10-CM | POA: Diagnosis not present

## 2023-09-09 DIAGNOSIS — Z79899 Other long term (current) drug therapy: Secondary | ICD-10-CM | POA: Diagnosis not present

## 2023-09-09 DIAGNOSIS — I471 Supraventricular tachycardia, unspecified: Secondary | ICD-10-CM | POA: Diagnosis not present

## 2023-09-09 DIAGNOSIS — I6529 Occlusion and stenosis of unspecified carotid artery: Secondary | ICD-10-CM | POA: Diagnosis not present

## 2023-09-09 DIAGNOSIS — Z0000 Encounter for general adult medical examination without abnormal findings: Secondary | ICD-10-CM | POA: Diagnosis not present

## 2023-09-09 DIAGNOSIS — E559 Vitamin D deficiency, unspecified: Secondary | ICD-10-CM | POA: Diagnosis not present

## 2023-09-09 DIAGNOSIS — I700 Atherosclerosis of aorta: Secondary | ICD-10-CM | POA: Diagnosis not present

## 2023-09-09 DIAGNOSIS — G4733 Obstructive sleep apnea (adult) (pediatric): Secondary | ICD-10-CM | POA: Diagnosis not present

## 2023-09-09 LAB — LAB REPORT - SCANNED
A1c: 6.8
Albumin, Urine POC: <0.7
Creatinine, POC: 126 mg/dL
EGFR (African American): 66
Microalb Creat Ratio: <5.5
TSH: 1.44 (ref 0.41–5.90)

## 2023-09-17 ENCOUNTER — Telehealth: Payer: Self-pay

## 2023-09-17 ENCOUNTER — Other Ambulatory Visit: Payer: Self-pay | Admitting: Family Medicine

## 2023-09-17 DIAGNOSIS — M8580 Other specified disorders of bone density and structure, unspecified site: Secondary | ICD-10-CM

## 2023-09-17 NOTE — Telephone Encounter (Signed)
 Spoke with patient and confirmed appointment on 09/18/23

## 2023-09-18 ENCOUNTER — Inpatient Hospital Stay: Payer: Medicare Other | Attending: Hematology and Oncology | Admitting: Hematology and Oncology

## 2023-09-18 ENCOUNTER — Encounter: Payer: Self-pay | Admitting: Hematology and Oncology

## 2023-09-18 ENCOUNTER — Inpatient Hospital Stay

## 2023-09-18 VITALS — BP 166/64 | HR 86 | Temp 97.6°F | Resp 16 | Wt 174.2 lb

## 2023-09-18 DIAGNOSIS — I10 Essential (primary) hypertension: Secondary | ICD-10-CM | POA: Diagnosis not present

## 2023-09-18 DIAGNOSIS — E119 Type 2 diabetes mellitus without complications: Secondary | ICD-10-CM | POA: Diagnosis not present

## 2023-09-18 DIAGNOSIS — Z79899 Other long term (current) drug therapy: Secondary | ICD-10-CM | POA: Insufficient documentation

## 2023-09-18 DIAGNOSIS — D649 Anemia, unspecified: Secondary | ICD-10-CM | POA: Diagnosis not present

## 2023-09-18 DIAGNOSIS — L732 Hidradenitis suppurativa: Secondary | ICD-10-CM | POA: Diagnosis not present

## 2023-09-18 DIAGNOSIS — E785 Hyperlipidemia, unspecified: Secondary | ICD-10-CM | POA: Insufficient documentation

## 2023-09-18 LAB — CMP (CANCER CENTER ONLY)
ALT: 6 U/L (ref 0–44)
AST: 11 U/L — ABNORMAL LOW (ref 15–41)
Albumin: 4.2 g/dL (ref 3.5–5.0)
Alkaline Phosphatase: 84 U/L (ref 38–126)
Anion gap: 3 — ABNORMAL LOW (ref 5–15)
BUN: 11 mg/dL (ref 8–23)
CO2: 32 mmol/L (ref 22–32)
Calcium: 9.4 mg/dL (ref 8.9–10.3)
Chloride: 105 mmol/L (ref 98–111)
Creatinine: 0.87 mg/dL (ref 0.44–1.00)
GFR, Estimated: >60 mL/min (ref >60)
Glucose, Bld: 115 mg/dL — ABNORMAL HIGH (ref 70–99)
Potassium: 3.4 mmol/L — ABNORMAL LOW (ref 3.5–5.1)
Sodium: 140 mmol/L (ref 135–145)
Total Bilirubin: 0.5 mg/dL (ref 0.0–1.2)
Total Protein: 7 g/dL (ref 6.5–8.1)

## 2023-09-18 LAB — CBC WITH DIFFERENTIAL/PLATELET
Abs Immature Granulocytes: 0.01 10*3/uL (ref 0.00–0.07)
Basophils Absolute: 0 10*3/uL (ref 0.0–0.1)
Basophils Relative: 1 %
Eosinophils Absolute: 0.1 10*3/uL (ref 0.0–0.5)
Eosinophils Relative: 3 %
HCT: 31.4 % — ABNORMAL LOW (ref 36.0–46.0)
Hemoglobin: 10.2 g/dL — ABNORMAL LOW (ref 12.0–15.0)
Immature Granulocytes: 0 %
Lymphocytes Relative: 35 %
Lymphs Abs: 1.6 10*3/uL (ref 0.7–4.0)
MCH: 25.5 pg — ABNORMAL LOW (ref 26.0–34.0)
MCHC: 32.5 g/dL (ref 30.0–36.0)
MCV: 78.5 fL — ABNORMAL LOW (ref 80.0–100.0)
Monocytes Absolute: 0.3 10*3/uL (ref 0.1–1.0)
Monocytes Relative: 8 %
Neutro Abs: 2.5 10*3/uL (ref 1.7–7.7)
Neutrophils Relative %: 53 %
Platelets: 195 10*3/uL (ref 150–400)
RBC: 4 MIL/uL (ref 3.87–5.11)
RDW: 14.8 % (ref 11.5–15.5)
WBC: 4.5 10*3/uL (ref 4.0–10.5)
nRBC: 0 % (ref 0.0–0.2)

## 2023-09-18 NOTE — Progress Notes (Signed)
 [facility] CONSULT NOTE  Patient Care Team: Olin Bertin, MD as PCP - General (Family Medicine) Maudine Sos, MD as Attending Physician (Cardiology)  CHIEF COMPLAINTS/PURPOSE OF CONSULTATION:  Abnormal labs.    ASSESSMENT & PLAN:   This is a very pleasant 78 year old female patient with past medical history significant for type 2 diabetes mellitus, hypertension, dyslipidemia, bilateral cataracts referred to hematology for evaluation of abnormal labs, specifically abnormal K/L ratio  Assessment and Plan Assessment & Plan Abnormal K/L ratio Labs in April 2024 showed ongoing mild anemia, no evidence of  elevated creatinine or hypercalcemia SPEP is unremarkable. K/L ratio stable, mildly abnormal, 1.96 Labs from today, Hb 10.2,microcytic hypochromic, mild anemia, CMP unremarkable,  Rest of the labs are pending We will review labs from today. We recommended iron supplementation, she just started taking iron recommended by her PCP  Hydradenitis Chronic skin condition with painful lumps, consistent with hydradenitis. No acute changes or complications noted. - Continue current management of hydradenitis.  Routine Health Maintenance Wellness visit completed. No new medications. Continues iron, B12, and vitamin D . No significant symptoms reported. - Continue current medications: iron, B12, and vitamin D . - Routine lab work at [facility]. - Follow-up in one year or as needed based on lab results.    Thank you for consulting us  in the care of this patient.  Please not hesitate to contact us  with any additional questions or concerns.  HISTORY OF PRESENTING ILLNESS:   Donna Wu 78 y.o. female is here because of abnormal labs  This is a very pleasant 78 year old female patient with past medical history significant for type 2 diabetes, hypertension, dyslipidemia, bilateral cataract referred to hematology for evaluation of abnormal labs which is primarily  elevated kappa lambda ratio.   Interim History  Discussed the use of AI scribe software for clinical note transcription with the patient, who gave verbal consent to proceed.  History of Present Illness Donna Wu is a 78 year old female who presents for follow-up of previously abnormal labs.  She has been feeling well and has not required any additional medical consultations aside from a wellness check last Tuesday. She has an upcoming cardiology appointment later in the year, between July and September.  Her initial visit was prompted by abnormal laboratory results, which were re-evaluated last year and found to be normal. It has been a year since the last check, and she is here to reassess her current status.  She continues to take iron, B12, and vitamin D  supplements and has not started any new medications since the last visit.  No fevers, drenching night sweats, or changes in appetite. Her appetite has improved since the last visit when she reported weight loss.  No issues with bowel movements, including no blood or black stool. She also denies any swelling in her legs or abdominal pain.  She mentions having 'the little hydradenite, the pimples everywhere,' but does not report any other skin issues.    Rest of the pertinent 10 point ROS reviewed and unremarkable.   MEDICAL HISTORY:  Past Medical History:  Diagnosis Date   Anxiety    Arthritis    Bronchitis    hx of   Carotid stenosis 12/24/2021   Cataracts, bilateral    Colon polyps    Diabetes mellitus without complication (HCC)    on oral medicine   GERD (gastroesophageal reflux disease)    H/O hiatal hernia    Hyperlipidemia    Hypertension    Dr. Genevia Kern  [facility]    Pneumonia    "as a Development worker, international aid"    SURGICAL HISTORY: Past Surgical History:  Procedure Laterality Date   ABDOMINAL HYSTERECTOMY     BREAST BIOPSY Right    No Scar visible    BREAST LUMPECTOMY  app. 40 years ago   BREAST SURGERY     CARDIOVASCULAR  STRESS TEST     done approx. 7 years ago   ESOPHAGOGASTRODUODENOSCOPY ENDOSCOPY     TOTAL KNEE ARTHROPLASTY  03/31/2012   Procedure: TOTAL KNEE ARTHROPLASTY;  Surgeon: Jasmine Mesi, MD;  Location: [facility] OR;  Service: Orthopedics;  Laterality: Left;  Left total knee arthroplasty   TUBAL LIGATION      SOCIAL HISTORY: Social History   Socioeconomic History   Marital status: Legally Separated    Spouse name: Not on file   Number of children: 4   Years of education: Not on file   Highest education level: Not on file  Occupational History   Not on file  Tobacco Use   Smoking status: Never   Smokeless tobacco: Never  Substance and Sexual Activity   Alcohol use: No   Drug use: No   Sexual activity: Not on file  Other Topics Concern   Not on file  Social History Narrative   Not on file   Social Drivers of Health   Financial Resource Strain: Not on file  Food Insecurity: Not on file  Transportation Needs: Not on file  Physical Activity: Not on file  Stress: Not on file  Social Connections: Unknown (10/02/2021)   [facility] from [facility], [facility]   Social Network    Social Network: Not on file  Intimate Partner Violence: Unknown (08/24/2021)   Received from [facility], [facility]   HITS    Physically Hurt: Not on file    Insult or Talk Down To: Not on file    Threaten Physical Harm: Not on file    Scream or Curse: Not on file    FAMILY HISTORY: Family History  Problem Relation Age of Onset   Hypertension Mother    Heart attack Mother 70   Stroke Mother    Stroke Father    Dementia Father    Hypertension Sister    Hypertension Sister    Hypertension Brother    Hypertension Brother    Lung cancer Brother    Breast cancer Neg Hx     ALLERGIES:  is allergic to metformin  and related.  MEDICATIONS:  Current Outpatient Medications  Medication Sig Dispense Refill   acetaminophen  (TYLENOL  8 HOUR) 650 MG CR tablet Take 650 tablets by mouth once. Once  as needed.     aspirin  81 MG chewable tablet 1 tablet     atorvastatin  (LIPITOR) 20 MG tablet Take 1 tablet by mouth daily.     benazepril  (LOTENSIN ) 10 MG tablet Take 1 tablet by mouth daily.     Cholecalciferol  (VITAMIN D ) 50 MCG (2000 UT) tablet Take 50 tablets by mouth once.     diclofenac Sodium (VOLTAREN) 1 % GEL See admin instructions.     EQ GENTLE LAXATIVE 5 MG EC tablet See admin instructions.     famotidine (PEPCID) 40 MG tablet Take 40 mg by mouth daily.     ferrous sulfate  325 (65 FE) MG tablet Take 1 tablet (325 mg total) by mouth 2 (two) times daily with a meal. 120 tablet 0   hydrochlorothiazide  (HYDRODIURIL ) 25 MG tablet Take 25 mg by mouth daily.  metoprolol  succinate (TOPROL -XL) 25 MG 24 hr tablet Take 25 mg by mouth daily.     omeprazole  (PRILOSEC) 40 MG capsule Take 40 mg by mouth daily.     Oyster Shell Calcium  500 MG TABS 1 tablet with a meal     sertraline  (ZOLOFT ) 50 MG tablet Take 50 mg by mouth daily.     vitamin B-12 (CYANOCOBALAMIN ) 500 MCG tablet Take 500 mcg by mouth in the morning and at bedtime.     No current facility-administered medications for this visit.     PHYSICAL EXAMINATION: ECOG PERFORMANCE STATUS: 0 - Asymptomatic  Vitals:   09/18/23 1001  BP: (!) 166/64  Pulse: 86  Resp: 16  Temp: 97.6 F (36.4 C)  SpO2: 98%    Filed Weights   09/18/23 1001  Weight: 174 lb 3.2 oz (79 kg)     Physical Exam Constitutional:      Appearance: Normal appearance.  Cardiovascular:     Rate and Rhythm: Normal rate and regular rhythm.     Pulses: Normal pulses.     Heart sounds: Normal heart sounds.  Pulmonary:     Effort: Pulmonary effort is normal.     Breath sounds: Normal breath sounds.  Musculoskeletal:        General: No swelling or tenderness. Normal range of motion.     Cervical back: Normal range of motion and neck supple. No rigidity.  Lymphadenopathy:     Cervical: No cervical adenopathy.  Skin:    General: Skin is warm and dry.   Neurological:     Mental Status: She is alert.     LABORATORY DATA:  I have reviewed the data as listed Lab Results  Component Value Date   WBC 4.2 09/16/2022   HGB 11.2 (L) 09/16/2022   HCT 34.6 (L) 09/16/2022   MCV 80.7 09/16/2022   PLT 225 09/16/2022     Chemistry      Component Value Date/Time   NA 140 09/16/2022 0807   K 3.7 09/16/2022 0807   CL 104 09/16/2022 0807   CO2 29 09/16/2022 0807   BUN 19 09/16/2022 0807   CREATININE 0.84 09/16/2022 0807      Component Value Date/Time   CALCIUM  10.1 09/16/2022 0807   ALKPHOS 85 09/16/2022 0807   AST 11 (L) 09/16/2022 0807   ALT 7 09/16/2022 0807   BILITOT 0.5 09/16/2022 0807     Reviewed labs.  RADIOGRAPHIC STUDIES: I have personally reviewed the radiological images as listed and agreed with the findings in the report. No results found.  All questions were answered. The patient knows to call the clinic with any problems, questions or concerns. I spent 30 minutes in the care of this patient including H and P, review of records, counseling and coordination of care.     Murleen Arms, MD 09/18/2023 10:22 AM

## 2023-09-19 LAB — KAPPA/LAMBDA LIGHT CHAINS
Kappa free light chain: 17.9 mg/L (ref 3.3–19.4)
Kappa, lambda light chain ratio: 2.03 — ABNORMAL HIGH (ref 0.26–1.65)
Lambda free light chains: 8.8 mg/L (ref 5.7–26.3)

## 2023-09-19 LAB — IGG, IGA, IGM
IgA: 80 mg/dL (ref 64–422)
IgG (Immunoglobin G), Serum: 1185 mg/dL (ref 586–1602)
IgM (Immunoglobulin M), Srm: 71 mg/dL (ref 26–217)

## 2023-09-22 LAB — PROTEIN ELECTROPHORESIS, SERUM, WITH REFLEX
A/G Ratio: 1.2 (ref 0.7–1.7)
Albumin ELP: 3.5 g/dL (ref 2.9–4.4)
Alpha-1-Globulin: 0.3 g/dL (ref 0.0–0.4)
Alpha-2-Globulin: 0.7 g/dL (ref 0.4–1.0)
Beta Globulin: 0.9 g/dL (ref 0.7–1.3)
Gamma Globulin: 1.2 g/dL (ref 0.4–1.8)
Globulin, Total: 3 g/dL (ref 2.2–3.9)
Total Protein ELP: 6.5 g/dL (ref 6.0–8.5)

## 2023-10-20 ENCOUNTER — Encounter: Payer: Self-pay | Admitting: Family Medicine

## 2023-10-24 ENCOUNTER — Encounter (HOSPITAL_BASED_OUTPATIENT_CLINIC_OR_DEPARTMENT_OTHER): Payer: Self-pay | Admitting: Cardiovascular Disease

## 2023-10-24 ENCOUNTER — Ambulatory Visit (HOSPITAL_BASED_OUTPATIENT_CLINIC_OR_DEPARTMENT_OTHER): Payer: Medicare Other | Admitting: Cardiovascular Disease

## 2023-10-24 VITALS — BP 132/70 | HR 81 | Ht 63.0 in | Wt 172.0 lb

## 2023-10-24 DIAGNOSIS — I10 Essential (primary) hypertension: Secondary | ICD-10-CM | POA: Diagnosis not present

## 2023-10-24 DIAGNOSIS — Z5181 Encounter for therapeutic drug level monitoring: Secondary | ICD-10-CM

## 2023-10-24 DIAGNOSIS — E785 Hyperlipidemia, unspecified: Secondary | ICD-10-CM

## 2023-10-24 DIAGNOSIS — I6523 Occlusion and stenosis of bilateral carotid arteries: Secondary | ICD-10-CM

## 2023-10-24 MED ORDER — ATORVASTATIN CALCIUM 40 MG PO TABS: 40.0000 mg | ORAL_TABLET | Freq: Every day | ORAL | 3 refills | Status: DC
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
## 2023-10-24 NOTE — Patient Instructions (Signed)
 Medication Instructions:  INCREASE YOUR ATORVASTATIN  40 MG DAILY   *If you need a refill on your cardiac medications before your next appointment, please call your pharmacy*  Lab Work: FASTING LP/CMET 2 TO 3 MONTHS   If you have labs (blood work) drawn today and your tests are completely normal, you will receive your results only by: MyChart Message (if you have MyChart) OR A paper copy in the mail If you have any lab test that is abnormal or we need to change your treatment, we will call you to review the results.  Testing/Procedures: NONE  Follow-Up: At [facility], you and your health needs are our priority.  As part of our continuing mission to provide you with exceptional heart care, our providers are all part of one team.  This team includes your primary Cardiologist (physician) and Advanced Practice Providers or APPs (Physician Assistants and Nurse Practitioners) who all work together to provide you with the care you need, when you need it.  Your next appointment:   12 month(s)  Provider:   Maudine Sos, MD, Slater Duncan, NP, or Neomi Banks, NP    We recommend signing up for the patient portal called "MyChart".  Sign up information is provided on this After Visit Summary.  MyChart is used to connect with patients for Virtual Visits (Telemedicine).  Patients are able to view lab/test results, encounter notes, upcoming appointments, etc.  Non-urgent messages can be sent to your provider as well.   To learn more about what you can do with MyChart, go to [email or web address].

## 2023-12-25 DIAGNOSIS — E785 Hyperlipidemia, unspecified: Secondary | ICD-10-CM | POA: Diagnosis not present

## 2023-12-25 DIAGNOSIS — I10 Essential (primary) hypertension: Secondary | ICD-10-CM | POA: Diagnosis not present

## 2023-12-26 LAB — COMPREHENSIVE METABOLIC PANEL WITH GFR
ALT: 6 IU/L (ref 0–32)
AST: 12 IU/L (ref 0–40)
Albumin: 4.2 g/dL (ref 3.8–4.8)
Alkaline Phosphatase: 109 IU/L (ref 44–121)
BUN/Creatinine Ratio: 21 (ref 12–28)
BUN: 22 mg/dL (ref 8–27)
Bilirubin Total: 0.3 mg/dL (ref 0.0–1.2)
CO2: 23 mmol/L (ref 20–29)
Calcium: 9.4 mg/dL (ref 8.7–10.3)
Chloride: 99 mmol/L (ref 96–106)
Creatinine, Ser: 1.04 mg/dL — ABNORMAL HIGH (ref 0.57–1.00)
Globulin, Total: 2.7 g/dL (ref 1.5–4.5)
Glucose: 128 mg/dL — ABNORMAL HIGH (ref 70–99)
Potassium: 4.1 mmol/L (ref 3.5–5.2)
Sodium: 139 mmol/L (ref 134–144)
Total Protein: 6.9 g/dL (ref 6.0–8.5)
eGFR: 55 mL/min/1.73 — ABNORMAL LOW (ref >59)

## 2023-12-26 LAB — LIPID PANEL
Chol/HDL Ratio: 2.5 ratio (ref 0.0–4.4)
Cholesterol, Total: 155 mg/dL (ref 100–199)
HDL: 62 mg/dL (ref >39)
LDL Chol Calc (NIH): 82 mg/dL (ref 0–99)
Triglycerides: 53 mg/dL (ref 0–149)
VLDL Cholesterol Cal: 11 mg/dL (ref 5–40)

## 2024-01-05 ENCOUNTER — Ambulatory Visit: Payer: Self-pay | Admitting: Cardiovascular Disease

## 2024-01-08 MED ORDER — ATORVASTATIN CALCIUM 80 MG PO TABS: 80.0000 mg | ORAL_TABLET | Freq: Every day | ORAL | Status: DC

## 2024-01-21 ENCOUNTER — Telehealth: Payer: Self-pay | Admitting: Cardiovascular Disease

## 2024-01-21 DIAGNOSIS — E785 Hyperlipidemia, unspecified: Secondary | ICD-10-CM

## 2024-01-21 DIAGNOSIS — I10 Essential (primary) hypertension: Secondary | ICD-10-CM

## 2024-01-21 DIAGNOSIS — Z5181 Encounter for therapeutic drug level monitoring: Secondary | ICD-10-CM

## 2024-01-21 MED ORDER — ATORVASTATIN CALCIUM 40 MG PO TABS: 40.0000 mg | ORAL_TABLET | Freq: Every day | ORAL | 3 refills | Status: AC

## 2024-01-21 NOTE — Telephone Encounter (Signed)
 Pt c/o medication issue:  1. Name of Medication: atorvastatin  (LIPITOR) 80 MG tablet  2. How are you currently taking this medication (dosage and times per day)?   Take 1 tablet (80 mg total) by mouth daily.    3. Are you having a reaction (difficulty breathing--STAT)? No  4. What is your medication issue? Pt needs prescription changed to 20 mg twice a day instead of 80 mg before the prescription order gets sent over to [facility] for mail order. Please advise.

## 2024-01-21 NOTE — Telephone Encounter (Signed)
 Spoke with patient regarding Atorvastatin   She has been taking 20 mg daily  Advised patient to increase to 40 mg daily and mailed lab orders to get rechecked 2 to 3 months after increasing

## 2024-03-01 ENCOUNTER — Encounter: Admitting: Surgical

## 2024-03-01 LAB — LIPID PANEL
Chol/HDL Ratio: 2.5 ratio (ref 0.0–4.4)
Cholesterol, Total: 128 mg/dL (ref 100–199)
HDL: 51 mg/dL (ref >39)
LDL Chol Calc (NIH): 65 mg/dL (ref 0–99)
Triglycerides: 56 mg/dL (ref 0–149)
VLDL Cholesterol Cal: 12 mg/dL (ref 5–40)

## 2024-03-01 LAB — COMPREHENSIVE METABOLIC PANEL WITH GFR
ALT: 7 IU/L (ref 0–32)
AST: 11 IU/L (ref 0–40)
Albumin: 4.3 g/dL (ref 3.8–4.8)
Alkaline Phosphatase: 101 IU/L (ref 49–135)
BUN/Creatinine Ratio: 12 (ref 12–28)
BUN: 12 mg/dL (ref 8–27)
Bilirubin Total: 0.5 mg/dL (ref 0.0–1.2)
CO2: 24 mmol/L (ref 20–29)
Calcium: 9.7 mg/dL (ref 8.7–10.3)
Chloride: 101 mmol/L (ref 96–106)
Creatinine, Ser: 1.02 mg/dL — ABNORMAL HIGH (ref 0.57–1.00)
Globulin, Total: 2.5 g/dL (ref 1.5–4.5)
Glucose: 144 mg/dL — ABNORMAL HIGH (ref 70–99)
Potassium: 4.1 mmol/L (ref 3.5–5.2)
Sodium: 141 mmol/L (ref 134–144)
Total Protein: 6.8 g/dL (ref 6.0–8.5)
eGFR: 57 mL/min/1.73 — ABNORMAL LOW (ref >59)

## 2024-03-05 ENCOUNTER — Ambulatory Visit (INDEPENDENT_AMBULATORY_CARE_PROVIDER_SITE_OTHER)

## 2024-03-05 DIAGNOSIS — I6523 Occlusion and stenosis of bilateral carotid arteries: Secondary | ICD-10-CM

## 2024-03-08 ENCOUNTER — Ambulatory Visit (HOSPITAL_BASED_OUTPATIENT_CLINIC_OR_DEPARTMENT_OTHER): Payer: Self-pay | Admitting: Family

## 2024-03-08 NOTE — Telephone Encounter (Signed)
 Patient is returning call.

## 2024-03-15 ENCOUNTER — Ambulatory Visit (HOSPITAL_BASED_OUTPATIENT_CLINIC_OR_DEPARTMENT_OTHER): Payer: Self-pay | Admitting: Family

## 2024-04-01 ENCOUNTER — Other Ambulatory Visit: Payer: Self-pay | Admitting: Family Medicine

## 2024-04-01 DIAGNOSIS — Z1231 Encounter for screening mammogram for malignant neoplasm of breast: Secondary | ICD-10-CM

## 2024-04-21 ENCOUNTER — Other Ambulatory Visit (HOSPITAL_BASED_OUTPATIENT_CLINIC_OR_DEPARTMENT_OTHER)

## 2024-05-04 ENCOUNTER — Ambulatory Visit

## 2024-05-04 ENCOUNTER — Telehealth: Payer: Self-pay | Admitting: Hematology and Oncology

## 2024-05-04 NOTE — Telephone Encounter (Signed)
 I left the patient a voicemail regarding 09/20/24 appt being rescheduled to 09/21/24.

## 2024-05-26 ENCOUNTER — Ambulatory Visit

## 2024-06-01 ENCOUNTER — Other Ambulatory Visit

## 2024-06-11 ENCOUNTER — Ambulatory Visit

## 2024-06-14 ENCOUNTER — Ambulatory Visit

## 2024-06-21 ENCOUNTER — Ambulatory Visit

## 2024-07-01 ENCOUNTER — Ambulatory Visit

## 2024-09-20 ENCOUNTER — Ambulatory Visit: Admitting: Hematology and Oncology

## 2024-09-21 ENCOUNTER — Inpatient Hospital Stay: Admitting: Hematology and Oncology

## 2024-09-23 ENCOUNTER — Other Ambulatory Visit (HOSPITAL_BASED_OUTPATIENT_CLINIC_OR_DEPARTMENT_OTHER)
